# Patient Record
Sex: Male | Born: 1940 | Race: White | Hispanic: No | Marital: Married | Smoking: Never smoker
Health system: Southern US, Community
[De-identification: ages and names within clinical notes are randomized; demographics above are authoritative.]

## PROBLEM LIST (undated history)

## (undated) DIAGNOSIS — I509 Heart failure, unspecified: Secondary | ICD-10-CM

## (undated) DIAGNOSIS — R42 Dizziness and giddiness: Secondary | ICD-10-CM

## (undated) DIAGNOSIS — E785 Hyperlipidemia, unspecified: Secondary | ICD-10-CM

## (undated) DIAGNOSIS — J45909 Unspecified asthma, uncomplicated: Secondary | ICD-10-CM

## (undated) DIAGNOSIS — I4891 Unspecified atrial fibrillation: Secondary | ICD-10-CM

## (undated) DIAGNOSIS — N400 Enlarged prostate without lower urinary tract symptoms: Secondary | ICD-10-CM

## (undated) DIAGNOSIS — N289 Disorder of kidney and ureter, unspecified: Secondary | ICD-10-CM

## (undated) DIAGNOSIS — I10 Essential (primary) hypertension: Secondary | ICD-10-CM

## (undated) DIAGNOSIS — C9590 Leukemia, unspecified not having achieved remission: Secondary | ICD-10-CM

## (undated) HISTORY — PX: OTHER SURGICAL HISTORY: SHX169

## (undated) HISTORY — PX: HERNIA REPAIR: SHX51

## (undated) HISTORY — PX: BICEPS TENDON REPAIR: SHX566

---

## 2006-11-14 ENCOUNTER — Emergency Department: Payer: Self-pay | Admitting: Emergency Medicine

## 2006-12-30 ENCOUNTER — Emergency Department: Payer: Self-pay | Admitting: Emergency Medicine

## 2006-12-30 ENCOUNTER — Other Ambulatory Visit: Payer: Self-pay

## 2010-10-26 ENCOUNTER — Observation Stay: Payer: Self-pay | Admitting: Internal Medicine

## 2011-06-18 ENCOUNTER — Inpatient Hospital Stay: Payer: Self-pay | Admitting: *Deleted

## 2011-06-18 LAB — URINALYSIS, COMPLETE
Bacteria: NONE SEEN
Blood: NEGATIVE
Granular Cast: 9
Leukocyte Esterase: NEGATIVE
Nitrite: NEGATIVE
Ph: 5 (ref 4.5–8.0)
Protein: 100
RBC,UR: 1 /HPF (ref 0–5)
Specific Gravity: 1.016 (ref 1.003–1.030)

## 2011-06-18 LAB — TROPONIN I: Troponin-I: 0.03 ng/mL

## 2011-06-18 LAB — CBC
MCHC: 34.2 g/dL (ref 32.0–36.0)
MCV: 99 fL (ref 80–100)
Platelet: 188 10*3/uL (ref 150–440)
RBC: 3.97 10*6/uL — ABNORMAL LOW (ref 4.40–5.90)
RDW: 13.7 % (ref 11.5–14.5)
WBC: 9.2 10*3/uL (ref 3.8–10.6)

## 2011-06-18 LAB — COMPREHENSIVE METABOLIC PANEL
BUN: 25 mg/dL — ABNORMAL HIGH (ref 7–18)
Chloride: 102 mmol/L (ref 98–107)
Co2: 25 mmol/L (ref 21–32)
EGFR (African American): 45 — ABNORMAL LOW
EGFR (Non-African Amer.): 37 — ABNORMAL LOW
Osmolality: 282 (ref 275–301)
Potassium: 3.8 mmol/L (ref 3.5–5.1)
SGOT(AST): 39 U/L — ABNORMAL HIGH (ref 15–37)
SGPT (ALT): 30 U/L
Sodium: 139 mmol/L (ref 136–145)

## 2011-06-18 LAB — CK TOTAL AND CKMB (NOT AT ARMC): CK-MB: 4.6 ng/mL — ABNORMAL HIGH (ref 0.5–3.6)

## 2011-06-19 LAB — LIPID PANEL
Cholesterol: 121 mg/dL (ref 0–200)
Triglycerides: 97 mg/dL (ref 0–200)

## 2011-06-19 LAB — CK-MB
CK-MB: 2.4 ng/mL (ref 0.5–3.6)
CK-MB: 2.1 ng/mL
CK-MB: 2 ng/mL (ref 0.5–3.6)

## 2011-06-19 LAB — BASIC METABOLIC PANEL
Anion Gap: 11 (ref 7–16)
BUN: 25 mg/dL — ABNORMAL HIGH (ref 7–18)
Calcium, Total: 8.3 mg/dL — ABNORMAL LOW (ref 8.5–10.1)
Chloride: 104 mmol/L (ref 98–107)
Creatinine: 1.62 mg/dL — ABNORMAL HIGH (ref 0.60–1.30)
EGFR (African American): 55 — ABNORMAL LOW
Glucose: 105 mg/dL — ABNORMAL HIGH (ref 65–99)

## 2011-06-19 LAB — BASIC METABOLIC PANEL WITH GFR
Anion Gap: 11
BUN: 19 mg/dL — ABNORMAL HIGH
Calcium, Total: 8.8 mg/dL
Chloride: 103 mmol/L
Co2: 24 mmol/L
Creatinine: 1.29 mg/dL
EGFR (African American): >60
EGFR (Non-African Amer.): 59 — ABNORMAL LOW
Glucose: 112 mg/dL — ABNORMAL HIGH
Osmolality: 279
Potassium: 3.8 mmol/L
Sodium: 138 mmol/L

## 2011-06-19 LAB — CBC WITH DIFFERENTIAL/PLATELET
Basophil #: 0 10*3/uL (ref 0.0–0.1)
Eosinophil %: 1 %
HCT: 35.8 % — ABNORMAL LOW (ref 40.0–52.0)
HGB: 12.4 g/dL — ABNORMAL LOW (ref 13.0–18.0)
Lymphocyte #: 1.5 10*3/uL (ref 1.0–3.6)
Lymphocyte %: 19.2 %
MCH: 34.3 pg — ABNORMAL HIGH (ref 26.0–34.0)
MCV: 99 fL (ref 80–100)
Monocyte #: 1.4 10*3/uL — ABNORMAL HIGH (ref 0.0–0.7)
Monocyte %: 18.4 %
Neutrophil %: 61 %
Platelet: 216 10*3/uL (ref 150–440)
RDW: 13.9 % (ref 11.5–14.5)

## 2011-06-19 LAB — MAGNESIUM: Magnesium: 1.4 mg/dL — ABNORMAL LOW

## 2011-06-19 LAB — TROPONIN I
Troponin-I: 0.03 ng/mL
Troponin-I: 0.03 ng/mL

## 2011-06-20 LAB — MAGNESIUM: Magnesium: 1.4 mg/dL — ABNORMAL LOW

## 2011-06-20 LAB — BASIC METABOLIC PANEL
Anion Gap: 8 (ref 7–16)
Calcium, Total: 8.8 mg/dL (ref 8.5–10.1)
Co2: 25 mmol/L (ref 21–32)
EGFR (African American): >60
Glucose: 103 mg/dL — ABNORMAL HIGH (ref 65–99)
Sodium: 137 mmol/L (ref 136–145)

## 2011-06-21 LAB — BASIC METABOLIC PANEL
Calcium, Total: 9.2 mg/dL (ref 8.5–10.1)
Calcium, Total: 9.4 mg/dL (ref 8.5–10.1)
Chloride: 100 mmol/L (ref 98–107)
Creatinine: 1.27 mg/dL (ref 0.60–1.30)
EGFR (African American): >60
EGFR (African American): >60
EGFR (Non-African Amer.): 55 — ABNORMAL LOW
EGFR (Non-African Amer.): 60 — ABNORMAL LOW
Glucose: 114 mg/dL — ABNORMAL HIGH (ref 65–99)
Glucose: 94 mg/dL (ref 65–99)
Osmolality: 280 (ref 275–301)
Potassium: 4.1 mmol/L (ref 3.5–5.1)
Sodium: 138 mmol/L (ref 136–145)

## 2011-06-21 LAB — CBC
HGB: 14.8 g/dL (ref 13.0–18.0)
Platelet: 262 10*3/uL (ref 150–440)
RBC: 4.45 10*6/uL (ref 4.40–5.90)
WBC: 8.7 10*3/uL (ref 3.8–10.6)

## 2011-06-21 LAB — MAGNESIUM: Magnesium: 1.8 mg/dL

## 2011-06-21 LAB — PROTIME-INR: Prothrombin Time: 11.9 secs (ref 11.5–14.7)

## 2011-06-23 ENCOUNTER — Emergency Department: Payer: Self-pay

## 2012-10-15 ENCOUNTER — Emergency Department: Payer: Self-pay | Admitting: Emergency Medicine

## 2014-01-28 ENCOUNTER — Emergency Department: Payer: Self-pay | Admitting: Emergency Medicine

## 2014-04-21 ENCOUNTER — Emergency Department: Payer: Self-pay | Admitting: Emergency Medicine

## 2014-04-21 LAB — BASIC METABOLIC PANEL
ANION GAP: 10 (ref 7–16)
BUN: 24 mg/dL — AB (ref 7–18)
CALCIUM: 8.6 mg/dL (ref 8.5–10.1)
CO2: 24 mmol/L (ref 21–32)
CREATININE: 1.66 mg/dL — AB (ref 0.60–1.30)
Chloride: 107 mmol/L (ref 98–107)
EGFR (African American): 53 — ABNORMAL LOW
GFR CALC NON AF AMER: 44 — AB
GLUCOSE: 114 mg/dL — AB (ref 65–99)
OSMOLALITY: 286 (ref 275–301)
Potassium: 3.6 mmol/L (ref 3.5–5.1)
SODIUM: 141 mmol/L (ref 136–145)

## 2014-04-21 LAB — URINALYSIS, COMPLETE
Bacteria: NONE SEEN
Bilirubin,UR: NEGATIVE
Blood: NEGATIVE
Glucose,UR: NEGATIVE mg/dL (ref 0–75)
Ketone: NEGATIVE
Leukocyte Esterase: NEGATIVE
NITRITE: NEGATIVE
PROTEIN: NEGATIVE
Ph: 6 (ref 4.5–8.0)
RBC,UR: NONE SEEN /HPF (ref 0–5)
SPECIFIC GRAVITY: 1.009 (ref 1.003–1.030)
Squamous Epithelial: NONE SEEN

## 2014-04-21 LAB — CBC WITH DIFFERENTIAL/PLATELET
Basophil #: 0.1 10*3/uL (ref 0.0–0.1)
Basophil %: 0.9 %
Eosinophil #: 0.1 10*3/uL (ref 0.0–0.7)
Eosinophil %: 1.1 %
HCT: 39.3 % — AB (ref 40.0–52.0)
HGB: 13.3 g/dL (ref 13.0–18.0)
LYMPHS PCT: 22.1 %
Lymphocyte #: 2 10*3/uL (ref 1.0–3.6)
MCH: 33.9 pg (ref 26.0–34.0)
MCHC: 33.8 g/dL (ref 32.0–36.0)
MCV: 101 fL — ABNORMAL HIGH (ref 80–100)
MONOS PCT: 13.5 %
Monocyte #: 1.2 x10 3/mm — ABNORMAL HIGH (ref 0.2–1.0)
NEUTROS ABS: 5.6 10*3/uL (ref 1.4–6.5)
Neutrophil %: 62.4 %
Platelet: 269 10*3/uL (ref 150–440)
RBC: 3.91 10*6/uL — ABNORMAL LOW (ref 4.40–5.90)
RDW: 13.3 % (ref 11.5–14.5)
WBC: 9 10*3/uL (ref 3.8–10.6)

## 2014-04-21 LAB — TROPONIN I
TROPONIN-I: 0.03 ng/mL
Troponin-I: 0.06 ng/mL — ABNORMAL HIGH

## 2014-07-28 NOTE — Discharge Summary (Signed)
PATIENT NAME:  Dylan Faulkner, Dylan Faulkner MR#:  [id number] DATE OF BIRTH:  May 20, 1940  DATE OF ADMISSION:  06/18/2011 DATE OF DISCHARGE:  06/21/2011  DISCHARGE DIAGNOSES:  1. Presyncope most likely secondary to orthostatic hypotension resolved, may be secondary to Flomax.  2. Hypertension.  3. Hyperlipidemia.  4. Acute on chronic renal failure improved.  5. Benign prostatic hypertrophy. 6. Episode of nonsustained V. tach may be related to hypomagnesemia. He is status post cardiac catheterization with normal coronaries. 7. Chronic obstructive pulmonary disease. 8. Gastroesophageal reflux disease. 9. History of acute myelocytic leukemia in remission.   CONSULTATIONS:  Cardiology, Dr. Neoma Laming.   HOSPITAL COURSE: This is a 74 year old male who has history of hypertension, hyperlipidemia, benign prostatic hypertrophy, chronic obstructive pulmonary disease, history of AML in the past. He presented with nausea, dizziness, presyncope, lightheadedness, he felt he is going to pass out. He was found to be orthostatic in the Emergency Room. He was recently started on Flomax also. When his wife took his blood pressure at home it was found to be around 87 systolic. He was admitted as presyncope may be secondary to orthostatic hypotension. Also, he was dehydrated with  acute renal failure when he came in. At admission he was found to have a creatinine of 1.93 with a BUN of 25 and he was admitted to telemetry. He ruled out for myocardial infarction. His white count was normal at 7.8, hemoglobin was 12.4 and chest x-ray at admission was negative. All his antihypertensives were held because he was hypotensive, and orthostatic and he was given IV hydration. With IV hydration his orthostatics resolved. His blood pressure medications gradually reintroduced and blood pressure is running high right now, so he is continued on Norvasc, chlorthalidone and I have increased his lisinopril to 10 mg daily. When he came in, his EKG  showed that he had sinus rhythm at 64. He had an ultrasound carotids done that did not show any hemodynamically significant stenosis. Echocardiogram showed that the patient had normal LV function and borderline systolic function with wall motion abnormalities. He had an ejection fraction of 50 to 55%, LV function is low normal. He had mild septal hypokinesis. The patient had an episode of nonsustained V. tach during the hospital stay. He was hypomagnesemic that was replaced aggressively. His magnesium at discharge is 1.8. Cardiology was consulted and they took him to cardiac catheterization today after giving him renal protection for cardiac catheterization and cardiac catheterization was essentially normal. He had normal coronaries with borderline LV function, nonsustained ventricular tachycardia, most likely secondary to electrolyte abnormalities. He did well with physical therapy. He is no longer dizzy. will continue to hold his flomax. He needs to have a follow-up renal function checked at his primary care physician's office. He may be a candidate for beta blocker, but considering his heart rate was heart rate is borderline when he came in, this can be started as an outpatient.  DISCHARGE MEDICATIONS: Omeprazole 20 mg daily. Simvastatin 20 mg daily. Aspirin 81 mg daily, Loratadine 10 mg daily.  Albuterol 1 puff every six hours as needed Advair Diskus 100/50  b.i.d., fluticasone nasal spray once daily, chlorthalidone 25 mg a day, amlodipine 5 mg, finasteride 5 mg daily and change lisinopril to 10 mg p.o. daily.  Do not take Flomax.  Also I will give him magnesium oxide 400 mg p.o. b.i.d. for 2 days.  DIET: Advised a low sodium, low cholesterol diet.   CONDITION AT DISCHARGE: He is comfortable with no further dizziness.  T-max 98.6, heart rate 74, blood pressure 155/89, saturating 96% on room air. Chest is clear. Heart sounds are regular. Abdomen soft, nontender.   FOLLOWUP:  Follow up with Dr. Mariea Clonts at [facility] in 1 week. Follow-up BMP at [facility]. With cardiology, Dr. Neoma Laming in 1-2 weeks.    ____________________________ Mena Pauls, MD ag:ljs D: 06/21/2011 16:04:39 ET T: 06/22/2011 12:20:26 ET JOB#: [id number]  cc: Mena Pauls, MD, <Dictator> Dionisio David, MD Dr Mariea Clonts, [facility] Mena Pauls MD ELECTRONICALLY SIGNED 06/29/2011 15:27

## 2014-07-28 NOTE — Consult Note (Signed)
PATIENT NAME:  Dylan Faulkner, Dylan Faulkner MR#:  [id number] DATE OF BIRTH:  January 25, 1941  DATE OF CONSULTATION:  06/21/2011  REFERRING PHYSICIAN:  A. Lyndel Safe, MD CONSULTING PHYSICIAN:  Merla Riches, PA-C  PRIMARY CARE PHYSICIAN: Dr. Michiel Cowboy,  [facility]: Presyncope, dizziness, chest pressure, nonsustained V. tach.   HISTORY OF PRESENT ILLNESS: Mr. Dylan Faulkner is a pleasant 74 year old white male who looks older than his stated age. He has a history of acute myelogenous leukemia, hypertension, skin cancer, and hyperlipidemia who was doing yard work on Friday 06/18/2011. He did a lot of stooping and bending and became swimmy-headed, dizzy, nauseated, and had associated chest pressure. He described the chest discomfort as a tightness that did not radiate and he had associated shortness of breath. The patient sat down, did not lose any consciousness, and proceeded to the Emergency Department. He has chronic shortness of breath as a result of his chemotherapy for acute myelogenous leukemia and has resultant coughing and wheezing. He has two-pillow orthopnea but denies any edema or claudication symptoms.  He notes that recently he has had some increased muscle cramps.   The patient had a previous episode in July 2012 of similar symptoms and at that time underwent carotid ultrasound, and CT of the head did not show any significant abnormalities.   PAST MEDICAL HISTORY:  1. Hypertension.  2. Acute myelogenous leukemia.  3. Skin cancer.  4. Hyperlipidemia.  5. Benign prostatic hypertrophy.  6. Sleep apnea, awaiting CPAP machine.  7. 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  8. Gastroesophageal reflux disease.  9. History of presyncope due to heat exposure in July 2012.  10. Peripheral vascular disease with history of carotid artery stenosis, 50% bilaterally.   PAST SURGICAL HISTORY: Right biceps tendon repair.   SOCIAL  HISTORY: The patient denies any history of smoking, alcohol, or illicit drug use. He lives with his wife. He is disabled due to affects of acute myelogenous leukemia.   FAMILY HISTORY: No history of heart disease. He has multiple family members with  hypertension, sister and mother with cerebrovascular accidents. Brother with lung cancer.   ALLERGIES: No known drug allergies.   HOME MEDICATIONS:  1. Allegra 180 mg daily.  2. Flonase two sprays in each nostril daily.  3. Proventil as needed for shortness of breath.  4. Omeprazole 20 mg daily.  5. Advair 250/50 1 puff twice daily.  6. Aspirin 81 mg daily.  7. Lisinopril 5 mg p.o. daily.  8. Chlorthalidone 25 mg p.o. daily.  9. Tylenol as needed.  10. Flomax 0.4 mg daily (this was recently added).  11. Simvastatin 20 mg p.o. at bedtime.  12. Proscar 5 mg p.o. daily.  13. Amlodipine 5 mg p.o. daily.  14. Multivitamin 1 tablet p.o. daily.   REVIEW OF SYSTEMS: The patient denies fever, fatigue, blurred vision, or double vision. He has seasonal allergies with coughing, wheezing, sneezing. He has associated chest pressure as mentioned in the history of present illness.   PHYSICAL EXAMINATION:  GENERAL: This is a pleasant male who is not in any acute distress. He is alert and oriented times three.  He looks older than his stated age.   VITAL SIGNS: Temperature 98 degrees Fahrenheit, heart rate is 83, respiratory rate 22, blood pressure 167/106.   HEENT: Head atraumatic, normocephalic.  Eyes: Pupils are round, equal bilaterally, reactive to light. There is no scleral icterus. Conjunctivae are pale, pink. Ears and nose are  normal to external inspection. Mouth poor dentition. Moist mucous membranes.   NECK: Supple. Trachea is midline. Thyroid is smooth and mobile.   LUNGS: Clear to auscultation bilaterally with no adventitious breath sounds appreciated.   CARDIOVASCULAR: Faint heart sounds. Regular rate and rhythm. No murmurs, rubs, or gallops  can be appreciated.   ABDOMEN: Nondistended and soft. Bowel sounds present in all four quadrants. No hepatosplenomegaly.   EXTREMITIES: No cyanosis, clubbing, or edema.   ANCILLARY DATA: Chest x-ray with no acute disease. Urinalysis with no evidence of infection. Glucose 114, BUN 18, creatinine 1.37, sodium 139, potassium 4.1, chloride 99, CO2 30, estimated GFR 55, osmolality 280, calcium 9.2, phosphorus is 2.6. Magnesium 1.8 (after giving magnesium). Total CK 228, CK-MB 2, troponin I 0.03. White blood cell count 7.8, hemoglobin 12.4, hematocrit 35.8, platelet count 216,000. Echocardiogram: Borderline left ventricular ejection fraction 50-55%, wall motion abnormality with mild septal hypokinesis suggestive of coronary artery disease, trace to mild mitral regurgitation.   Carotid ultrasound: No hemodynamically significant carotid artery stenosis.   EKG: Normal sinus rhythm, rhythm, 93 beats per minute, premature atrial contractions, nonspecific ST and T wave changes. Review of telemetry shows episode of nonsustained V. tach at 3:00 a.m.   ASSESSMENT/PLAN:  1. Presyncope/vertigo, possibly secondary to orthostatic hypotension: The patient has had low blood pressure at home and was recently started on Flomax which could be contributing to his symptoms. His cardiac enzymes have been negative times three and he does not have any acute ST or T wave changes on his EKG today.  Antihypertensives have been held with the exception of Amlodipine which has already been restarted. Since his blood pressure, can restart lisinopril at 10 mg per day as he has decreased left ventricular ejection fraction. He had a head CT scan in July 2012 that was negative.  2. Hypertension: The patient blood pressure medicines have been held as noted above. We will restart lisinopril in addition to Amlodipine.  3. Chest tightness: The patient has septal hypokinesis on his echocardiogram and we will need to rule out cardiac ischemia.  The patient will be set up for cardiac catheterization later today. The procedure along with risks were discussed with the patient.  4. Hyperlipidemia: The patient is currently on statin therapy.  We will continue to follow this patient with you.   Thank you very much for this consultation and allowing [place] to participate in this patient's care. The case was discussed with Dr. Neoma Laming who agrees with the assessment and management of the patient above.    ____________________________ Merla Riches, PA-C for Neoma Laming, MD mam:bjt D: 06/21/2011 09:50:20 ET T: 06/21/2011 10:12:26 ET JOB#: [id number]  cc: Merla Riches, PA-C, <Dictator> Dr. Michiel Cowboy, [place] SIGNED 06/21/2011 16:30

## 2014-07-28 NOTE — Consult Note (Signed)
Brief Consult Note: Diagnosis: Pre-syncope, non-sustained v tach.   Patient was seen by consultant.   Consult note dictated.   Comments: Patient with episode of pre-syncope, orthostatic vitals, and had associated chest pressure during episode. Non-sustained v tach likely from combination of low Mg and sleep apnea (awaiting CPAP). No cardiac enzyme elevations or acute ST/T wave changes on EKG, but has septal hyokinesis on echo, and will need to r/o coronary ischemia. Will get stress test today. Since BP elevated, after stress test can restart ACE-I.  Electronic Signatures: Angelica Ran (MD)   (Signed 18-Mar-13 09:09)  Co-Signer: Brief Consult Note Wolfgang Finigan A (PA-C)   (Signed 18-Mar-13 08:51)  Authored: Brief Consult Note  Last Updated: 18-Mar-13 09:09 by Angelica Ran (MD)

## 2014-07-28 NOTE — H&P (Signed)
PATIENT NAME:  Dylan Faulkner, Dylan Faulkner MR#:  [id number] DATE OF BIRTH:  July 19, 1940  DATE OF ADMISSION:  06/18/2011  ER REFERRING PHYSICIAN: Pollie Friar, MD  PRIMARY CARE PHYSICIAN: [facility].  CHIEF COMPLAINT: Presyncope, lightheadedness, dizziness, and nausea.   HISTORY OF PRESENT ILLNESS: The patient is a 74 year old male with past medical history of AML, hypertension, skin cancer, and hyperlipidemia who was doing some light yard work and putting his tools away today when he all of a sudden developed lightheadedness and dizziness associated with some nausea. The patient felt like he was going to pass out; therefore, he sat down. He did not lose any consciousness. He felt that everything around him was spinning. When he went for the x-rays today, in the ED, he had similar symptoms. As per the ED physician, she found him to be orthostatic in the Emergency Room. The patient was recently started on Flomax, on 06/12/2011. A few days ago, he reported that he had some ringing in his ears. When his wife took his blood pressure at home, she found him to be hypotensive. His systolic blood pressure was around 87. The patient reports some cough and nasal congestion. The patient was admitted with similar symptoms in July 2012. At that time, he was diagnosed with presyncope due to heat exposure and benign positional vertigo. He had a carotid ultrasound which showed 50% stenosis on both sides and CT of the head was negative.   ALLERGIES: No known drug allergies.   PAST MEDICAL HISTORY:  1. Hypertension.  2. Acute myelocytic leukemia.  3. Skin cancer. 4. Hyperlipidemia. 5. Benign prostatic hypertrophy. 6. History of presyncope due to heat exposure, in July 2012. 7. History of benign positional vertigo.  8. Chronic obstructive pulmonary disease. 9. Gastroesophageal reflux disease.   SOCIAL HISTORY: He denies any history of smoking, alcohol, or drug abuse. He lives with his wife. He is disabled due to his  AML.    FAMILY HISTORY: Brother had hypertension and lung cancer. Mother had dementia.   CURRENT MEDICATIONS:  1. Allegra 180 mg daily.  2. Flonase two sprays to each nostril daily.  3. Proventil p.r.n. 4. Omeprazole 20 mg daily.  5. Advair 250/50 one puff twice a day. 6. Aspirin 81 mg daily.  7. Lisinopril 5 mg daily.  8. Chlorthalidone 25 mg daily.  9. Tylenol p.r.n. 10. Flomax 0.4 mg daily. 11. Simvastatin 20 mg daily.  12. Proscar 5 mg daily.  13. Amlodipine 5 mg daily.   REVIEW OF SYSTEMS: CONSTITUTIONAL: The patient denies any fatigue or fever. EYES: Denies any blurred or double vision. ENT: Reports some tinnitus a couple of days ago. Denies any ear pain. He has some hearing loss. Reports some cough. Denies any wheezing or dyspnea. CVS: Denies any chest pain or palpitations. GASTROINTESTINAL: Had some nausea earlier. Denies any vomiting or diarrhea. GU: Denies any dysuria or hematuria. ENDOCRINE: Denies any polyuria or nocturia. HEME/LYMPH: Denies any anemia or easy bruisability. INTEGUMENT: Denies any acne or rash. MUSCULOSKELETAL: Denies any swelling or gout. NEUROLOGICAL: Denies any numbness or weakness. PSYCH: Denies any anxiety or nervousness.   PHYSICAL EXAMINATION:   VITAL SIGNS: Temperature 98.8, heart rate 98, respiratory rate 20, blood pressure 103/60, and pulse oximetry 94% on room air.   GENERAL: The patient is a 74 year old male lying comfortably in bed, not in acute distress.   HEAD: Atraumatic, normocephalic.   EYES: No pallor, icterus, or cyanosis. Pupils are equally round and reactive to light and accommodation. Extraocular movements intact.  ENT: Wet mucous membranes. No oropharyngeal erythema or thrush.   NECK: Supple. No masses. No JVD. No thyromegaly. No lymphadenopathy.   CHEST WALL: No tenderness to palpation. Not using accessory muscles of respiration. No intercostal muscle retractions.   LUNGS: Bilaterally clear to auscultation. No wheezing, rales,  or rhonchi.   CARDIOVASCULAR: S1 and S2 regular. No murmurs, rubs, or gallops.   ABDOMEN: Soft, nontender, and nondistended. No guarding or rigidity. No organomegaly. Normal bowel sounds.   SKIN: No rashes or lesions.   PERIPHERIES: No pedal edema. 1+ pedal pulses.   MUSCULOSKELETAL: No cyanosis or clubbing.   NEUROLOGIC: Awake, alert, and oriented x3. Nonfocal neurological exam. Cranial nerves grossly intact.   PSYCH: Normal mood and affect.  RESULTS: Urinalysis shows no evidence of infection.   Chest x-ray shows no acute disease.   White count 9.2, hemoglobin 13.5, hematocrit 39.4, platelets 188. CK 464. Troponin 0..03. Glucose 93, BUN 25, creatinine 1.93, AST 39.   ASSESSMENT AND PLAN: This is a 74 year old male with past medical history of hypertension, AML in remission, hyperlipidemia and benign prostatic hypertrophy who recently started taking Flomax who presents with presyncopal symptoms and vertigo.  1. Presyncope/vertigo of unclear etiology, possibly related to orthostatic hypotension. The patient was found to be orthostatic in the Emergency Room. He also had low blood pressure at home. He was recently started on Flomax which could also be the culprit. We will admit the patient to the hospital on telemetry, check serial cardiac enzymes, hold his antihypertensive medications including amlodipine, lisinopril, and the diuretic chlorthalidone, and Flomax. The patient has had a prior CAT scan which has been negative. Therefore, we will not repeat that. His neurological examination is nonfocal. We will do a 2-D echocardiogram and carotid ultrasound since he had 50% stenosis on both sides in July 2012. We will also check orthostatic vital signs and hydrate with IV fluids. 2. Acute renal failure. The patient's baseline creatinine is around 1.3. His creatinine today is 1.93. We will hydrate with IV fluids, monitor strict ins and outs, and avoid nephrotoxic medications. The etiology is unclear  at present. 3. Mildly elevated CK. Not in the range of rhabdomyolysis. We will hydrate with IV fluids.  4. Hypertension. The patient was hypotensive; therefore, we will hold his antihypertensive medication.  5. Acute myelocytic leukemia. The patient is currently in remission. 6. Hyperlipidemia. We will continue statin therapy.  7. Benign prostatic hypertrophy. We will continue Proscar. 8. History of chronic obstructive pulmonary disease. We will continue Advair and Proventil and place on p.r.n. nebulizer treatment. Currently there is no evidence of any exacerbation.  9. Gastroesophageal reflux disease. We will continue PPI therapy. We will place on GI and DVT prophylaxis.   I reviewed all medical records, discussed with the ED physician, and discussed with the patient and his son the plan of care and management.   TIME SPENT: 75 minutes. ____________________________ Cherre Huger, MD sp:slb D: 06/18/2011 19:51:40 ET T: 06/19/2011 08:11:51 ET JOB#: [id number]  cc: Cherre Huger, MD, <Dictator> PCP - [facility] MD ELECTRONICALLY SIGNED 06/21/2011 7:36

## 2015-05-15 ENCOUNTER — Observation Stay
Admission: EM | Admit: 2015-05-15 | Discharge: 2015-05-16 | Disposition: A | Discharge status: Home / Self Care | Payer: Medicare HMO | Attending: Internal Medicine | Admitting: Internal Medicine

## 2015-05-15 ENCOUNTER — Emergency Department: Payer: Medicare HMO

## 2015-05-15 DIAGNOSIS — I5020 Unspecified systolic (congestive) heart failure: Secondary | ICD-10-CM | POA: Insufficient documentation

## 2015-05-15 DIAGNOSIS — R5383 Other fatigue: Secondary | ICD-10-CM | POA: Diagnosis not present

## 2015-05-15 DIAGNOSIS — I214 Non-ST elevation (NSTEMI) myocardial infarction: Secondary | ICD-10-CM

## 2015-05-15 DIAGNOSIS — I4891 Unspecified atrial fibrillation: Secondary | ICD-10-CM | POA: Insufficient documentation

## 2015-05-15 DIAGNOSIS — Z7901 Long term (current) use of anticoagulants: Secondary | ICD-10-CM | POA: Insufficient documentation

## 2015-05-15 DIAGNOSIS — R748 Abnormal levels of other serum enzymes: Secondary | ICD-10-CM | POA: Insufficient documentation

## 2015-05-15 DIAGNOSIS — R0602 Shortness of breath: Secondary | ICD-10-CM | POA: Diagnosis not present

## 2015-05-15 DIAGNOSIS — I2510 Atherosclerotic heart disease of native coronary artery without angina pectoris: Secondary | ICD-10-CM | POA: Diagnosis not present

## 2015-05-15 DIAGNOSIS — N179 Acute kidney failure, unspecified: Secondary | ICD-10-CM | POA: Diagnosis not present

## 2015-05-15 DIAGNOSIS — R55 Syncope and collapse: Principal | ICD-10-CM | POA: Insufficient documentation

## 2015-05-15 DIAGNOSIS — Z7982 Long term (current) use of aspirin: Secondary | ICD-10-CM | POA: Diagnosis not present

## 2015-05-15 DIAGNOSIS — I5022 Chronic systolic (congestive) heart failure: Secondary | ICD-10-CM | POA: Insufficient documentation

## 2015-05-15 DIAGNOSIS — Z7951 Long term (current) use of inhaled steroids: Secondary | ICD-10-CM | POA: Diagnosis not present

## 2015-05-15 DIAGNOSIS — R42 Dizziness and giddiness: Secondary | ICD-10-CM | POA: Insufficient documentation

## 2015-05-15 DIAGNOSIS — R079 Chest pain, unspecified: Secondary | ICD-10-CM | POA: Insufficient documentation

## 2015-05-15 DIAGNOSIS — I129 Hypertensive chronic kidney disease with stage 1 through stage 4 chronic kidney disease, or unspecified chronic kidney disease: Secondary | ICD-10-CM | POA: Insufficient documentation

## 2015-05-15 DIAGNOSIS — E785 Hyperlipidemia, unspecified: Secondary | ICD-10-CM | POA: Diagnosis not present

## 2015-05-15 DIAGNOSIS — I482 Chronic atrial fibrillation: Secondary | ICD-10-CM | POA: Insufficient documentation

## 2015-05-15 DIAGNOSIS — Z79899 Other long term (current) drug therapy: Secondary | ICD-10-CM | POA: Insufficient documentation

## 2015-05-15 DIAGNOSIS — N189 Chronic kidney disease, unspecified: Secondary | ICD-10-CM | POA: Insufficient documentation

## 2015-05-15 DIAGNOSIS — E860 Dehydration: Secondary | ICD-10-CM | POA: Diagnosis not present

## 2015-05-15 DIAGNOSIS — Z8673 Personal history of transient ischemic attack (TIA), and cerebral infarction without residual deficits: Secondary | ICD-10-CM | POA: Insufficient documentation

## 2015-05-15 DIAGNOSIS — N400 Enlarged prostate without lower urinary tract symptoms: Secondary | ICD-10-CM | POA: Diagnosis not present

## 2015-05-15 HISTORY — DX: Hyperlipidemia, unspecified: E78.5

## 2015-05-15 HISTORY — DX: Essential (primary) hypertension: I10

## 2015-05-15 HISTORY — DX: Dizziness and giddiness: R42

## 2015-05-15 HISTORY — DX: Leukemia, unspecified not having achieved remission: C95.90

## 2015-05-15 HISTORY — DX: Benign prostatic hyperplasia without lower urinary tract symptoms: N40.0

## 2015-05-15 HISTORY — DX: Heart failure, unspecified: I50.9

## 2015-05-15 LAB — CBC
HEMATOCRIT: 35.8 % — AB (ref 40.0–52.0)
HEMOGLOBIN: 12.2 g/dL — AB (ref 13.0–18.0)
MCH: 33.2 pg (ref 26.0–34.0)
MCHC: 34.1 g/dL (ref 32.0–36.0)
MCV: 97.6 fL (ref 80.0–100.0)
Platelets: 184 10*3/uL (ref 150–440)
RBC: 3.67 MIL/uL — AB (ref 4.40–5.90)
RDW: 14.8 % — ABNORMAL HIGH (ref 11.5–14.5)
WBC: 8.1 10*3/uL (ref 3.8–10.6)

## 2015-05-15 NOTE — ED Notes (Signed)
Pt reports he was taking the trash down to the street and began to have central/left chest pain rated at an 8 out of 10 described as tightness. Pt reports he also experienced lightheadedness/dizziness, SOB, nausea. Pt denies diaphoresis. Pt is still c/o of all symptoms. Pt reports he had similar episode 2 months ago and was prescribed nitroglycerin. Pt reports he did not take any of the nitro prescription this evening.

## 2015-05-15 NOTE — ED Notes (Signed)
Pt arrived via EMS - c/o chest pain and SOB after taking the trash out tonight - Pt has Nitro ordered but did not take - Hx of HTN, heart failure, vertigo, chest pain - Pt was at his doctor yesterday for vertigo

## 2015-05-15 NOTE — ED Notes (Signed)
MD Brown at bedside.

## 2015-05-16 DIAGNOSIS — R55 Syncope and collapse: Secondary | ICD-10-CM | POA: Diagnosis present

## 2015-05-16 LAB — BASIC METABOLIC PANEL
ANION GAP: 6 (ref 5–15)
ANION GAP: 7 (ref 5–15)
BUN: 45 mg/dL — AB (ref 6–20)
BUN: 44 mg/dL — AB (ref 6–20)
CALCIUM: 8.6 mg/dL — AB (ref 8.9–10.3)
CO2: 26 mmol/L (ref 22–32)
CO2: 27 mmol/L (ref 22–32)
CREATININE: 1.84 mg/dL — AB (ref 0.61–1.24)
Calcium: 8.8 mg/dL — ABNORMAL LOW (ref 8.9–10.3)
Chloride: 106 mmol/L (ref 101–111)
Chloride: 104 mmol/L (ref 101–111)
Creatinine, Ser: 2.06 mg/dL — ABNORMAL HIGH (ref 0.61–1.24)
GFR calc Af Amer: 41 mL/min — ABNORMAL LOW (ref >60)
GFR, EST AFRICAN AMERICAN: 35 mL/min — AB (ref >60)
GFR, EST NON AFRICAN AMERICAN: 31 mL/min — AB (ref >60)
GFR, EST NON AFRICAN AMERICAN: 35 mL/min — AB (ref >60)
GLUCOSE: 103 mg/dL — AB (ref 65–99)
Glucose, Bld: 107 mg/dL — ABNORMAL HIGH (ref 65–99)
POTASSIUM: 4.4 mmol/L (ref 3.5–5.1)
Potassium: 4.2 mmol/L (ref 3.5–5.1)
SODIUM: 138 mmol/L (ref 135–145)
Sodium: 138 mmol/L (ref 135–145)

## 2015-05-16 LAB — TROPONIN I
TROPONIN I: 0.06 ng/mL — AB (ref <0.031)
Troponin I: 0.06 ng/mL — ABNORMAL HIGH (ref <0.031)
Troponin I: 0.06 ng/mL — ABNORMAL HIGH (ref <0.031)
Troponin I: 0.06 ng/mL — ABNORMAL HIGH (ref <0.031)

## 2015-05-16 LAB — HEMOGLOBIN A1C: Hgb A1c MFr Bld: 5.6 % (ref 4.0–6.0)

## 2015-05-16 LAB — PROTIME-INR
INR: 3.65
INR: 3.79
PROTHROMBIN TIME: 35.5 s — AB (ref 11.4–15.0)
Prothrombin Time: 36.5 seconds — ABNORMAL HIGH (ref 11.4–15.0)

## 2015-05-16 LAB — TSH: TSH: 3.209 u[IU]/mL (ref 0.350–4.500)

## 2015-05-16 MED ORDER — ONDANSETRON HCL 4 MG/2ML IJ SOLN: 4.0000 mg | Freq: Four times a day (QID) | INTRAMUSCULAR | Status: DC | PRN

## 2015-05-16 MED ORDER — MORPHINE SULFATE (PF) 4 MG/ML IV SOLN
4.0000 mg | Freq: Once | INTRAVENOUS | Status: AC
  Administered 2015-05-16: 4 mg via INTRAVENOUS

## 2015-05-16 MED ORDER — SODIUM CHLORIDE 0.9% FLUSH
3.0000 mL | Freq: Two times a day (BID) | INTRAVENOUS | Status: DC
  Administered 2015-05-16: 3 mL via INTRAVENOUS

## 2015-05-16 MED ORDER — SODIUM CHLORIDE 0.9 % IV SOLN
INTRAVENOUS | Status: DC
  Administered 2015-05-16: 04:00:00 via INTRAVENOUS

## 2015-05-16 MED ORDER — ASPIRIN 81 MG PO CHEW
324.0000 mg | CHEWABLE_TABLET | Freq: Once | ORAL | Status: AC
  Administered 2015-05-16: 324 mg via ORAL
  Filled 2015-05-16: qty 4

## 2015-05-16 MED ORDER — GABAPENTIN 300 MG PO CAPS
600.0000 mg | ORAL_CAPSULE | Freq: Three times a day (TID) | ORAL | Status: DC
  Administered 2015-05-16: 600 mg via ORAL
  Filled 2015-05-16: qty 2

## 2015-05-16 MED ORDER — HYDROMORPHONE HCL 1 MG/ML IJ SOLN
1.0000 mg | Freq: Once | INTRAMUSCULAR | Status: AC
  Administered 2015-05-16: 1 mg via INTRAVENOUS
  Filled 2015-05-16: qty 1

## 2015-05-16 MED ORDER — FINASTERIDE 5 MG PO TABS
5.0000 mg | ORAL_TABLET | Freq: Every day | ORAL | Status: DC
  Administered 2015-05-16: 5 mg via ORAL
  Filled 2015-05-16: qty 1

## 2015-05-16 MED ORDER — NITROGLYCERIN 0.4 MG SL SUBL: 0.4000 mg | SUBLINGUAL_TABLET | SUBLINGUAL | Status: DC | PRN

## 2015-05-16 MED ORDER — ONDANSETRON HCL 4 MG PO TABS: 4.0000 mg | ORAL_TABLET | Freq: Four times a day (QID) | ORAL | Status: DC | PRN

## 2015-05-16 MED ORDER — METOPROLOL SUCCINATE ER 25 MG PO TB24
25.0000 mg | ORAL_TABLET | Freq: Every day | ORAL | Status: DC
  Administered 2015-05-16: 25 mg via ORAL
  Filled 2015-05-16: qty 1

## 2015-05-16 MED ORDER — MAGNESIUM OXIDE 400 (241.3 MG) MG PO TABS
400.0000 mg | ORAL_TABLET | Freq: Every day | ORAL | Status: DC
  Administered 2015-05-16: 400 mg via ORAL
  Filled 2015-05-16: qty 1

## 2015-05-16 MED ORDER — TAMSULOSIN HCL 0.4 MG PO CAPS: 0.4000 mg | ORAL_CAPSULE | Freq: Every day | ORAL | Status: DC

## 2015-05-16 MED ORDER — ISOSORBIDE MONONITRATE ER 30 MG PO TB24
30.0000 mg | ORAL_TABLET | Freq: Every day | ORAL | Status: DC
  Administered 2015-05-16: 30 mg via ORAL
  Filled 2015-05-16: qty 1

## 2015-05-16 MED ORDER — FUROSEMIDE 20 MG PO TABS
20.0000 mg | ORAL_TABLET | Freq: Two times a day (BID) | ORAL | Status: DC
  Administered 2015-05-16: 20 mg via ORAL
  Filled 2015-05-16: qty 1

## 2015-05-16 MED ORDER — LISINOPRIL 10 MG PO TABS
10.0000 mg | ORAL_TABLET | Freq: Every day | ORAL | Status: DC
  Administered 2015-05-16: 10 mg via ORAL
  Filled 2015-05-16: qty 1

## 2015-05-16 MED ORDER — ASPIRIN 81 MG PO CHEW
81.0000 mg | CHEWABLE_TABLET | Freq: Every day | ORAL | Status: DC
  Administered 2015-05-16: 81 mg via ORAL
  Filled 2015-05-16: qty 1

## 2015-05-16 MED ORDER — DOCUSATE SODIUM 100 MG PO CAPS
100.0000 mg | ORAL_CAPSULE | Freq: Two times a day (BID) | ORAL | Status: DC
  Administered 2015-05-16 (×2): 100 mg via ORAL
  Filled 2015-05-16 (×2): qty 1

## 2015-05-16 MED ORDER — ACETAMINOPHEN 325 MG PO TABS
650.0000 mg | ORAL_TABLET | Freq: Four times a day (QID) | ORAL | Status: DC | PRN
Start: 2015-05-16 — End: 2015-05-16

## 2015-05-16 MED ORDER — ONDANSETRON HCL 4 MG/2ML IJ SOLN
4.0000 mg | Freq: Once | INTRAMUSCULAR | Status: AC
  Administered 2015-05-16: 4 mg via INTRAVENOUS
  Filled 2015-05-16: qty 2

## 2015-05-16 MED ORDER — SIMVASTATIN 20 MG PO TABS
20.0000 mg | ORAL_TABLET | Freq: Every day | ORAL | Status: DC
  Administered 2015-05-16: 20 mg via ORAL
  Filled 2015-05-16: qty 1

## 2015-05-16 MED ORDER — MORPHINE SULFATE (PF) 4 MG/ML IV SOLN
INTRAVENOUS | Status: AC
  Administered 2015-05-16: 4 mg via INTRAVENOUS
  Filled 2015-05-16: qty 1

## 2015-05-16 MED ORDER — WARFARIN SODIUM 5 MG PO TABS: 5.0000 mg | ORAL_TABLET | Freq: Every day | ORAL | Status: DC

## 2015-05-16 MED ORDER — MORPHINE SULFATE (PF) 2 MG/ML IV SOLN: 2.0000 mg | INTRAVENOUS | Status: DC | PRN

## 2015-05-16 MED ORDER — SODIUM CHLORIDE 0.9 % IV SOLN: INTRAVENOUS | Status: DC

## 2015-05-16 MED ORDER — ACETAMINOPHEN 650 MG RE SUPP
650.0000 mg | Freq: Four times a day (QID) | RECTAL | Status: DC | PRN
Start: 2015-05-16 — End: 2015-05-16

## 2015-05-16 MED ORDER — MORPHINE SULFATE (PF) 2 MG/ML IV SOLN
2.0000 mg | Freq: Once | INTRAVENOUS | Status: AC
  Administered 2015-05-16: 2 mg via INTRAVENOUS
  Filled 2015-05-16: qty 1

## 2015-05-16 MED ORDER — ISOSORBIDE MONONITRATE ER 30 MG PO TB24: 30.0000 mg | ORAL_TABLET | Freq: Every day | ORAL | Status: AC

## 2015-05-16 NOTE — Discharge Summary (Signed)
[facility] NAME: Dylan Faulkner    MR#:  [id number]  DATE OF BIRTH:  04-Nov-1940  DATE OF ADMISSION:  05/15/2015 ADMITTING PHYSICIAN: Harrie Foreman, MD  DATE OF DISCHARGE: 05/16/2015  PRIMARY CARE PHYSICIAN: Juanell Fairly, MD    ADMISSION DIAGNOSIS:  NSTEMI (non-ST elevated myocardial infarction) [facility]) [I21.4] Chest pain, unspecified chest pain type [R07.9] Syncope, unspecified syncope type [R55]  DISCHARGE DIAGNOSIS:  Active Problems:   Syncope   SECONDARY DIAGNOSIS:   Past Medical History  Diagnosis Date  . Hypertension   . Heart failure ([place])   . Vertigo   . 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 Hyperlipidemia   . Enlarged prostate     HOSPITAL COURSE:   75 year old male with a history of atrial fibrillation and systolic heart failure presented chest pain. Further details please further H&P.  1. Chest pain: Troponins 3 were 0.06. This is not acute coronary syndrome. EKG showed no evidence of myocardial infarction. Telemetry showed no evidence of myocardial infection. Patient seen and evaluated cardiology. Patient will have outpatient stress test performed on Monday. Patient had no chest pain on the hospital. Patient does have a cardiologist at [facility] however will see Dr Humphrey Rolls for stress test.   2. Chronic systolic heart failure: Ejection depression 40%. Patient will continue Lasix.  3. Acute on chronic injury: This is due to prerenal azotemia from dehydration. Patient's creatinine had improved with IV fluids. Patient was on Lasix and lisinopril on the hospital with improved creatinine.   4. Atrial fibrillation, chronic: Patient's INR was elevated to Coumadin was on hold. He will need close follow-up for INR check. He was given instructions to restart this on Sunday. 5. Essential hypertension: Continue lisinopril and metoprolol. Blood pressure is tolerable.  6. BPH: Continue finasteride and  Flomax.   DISCHARGE CONDITIONS AND DIET:   Patient is able to be discharged home in stable condition on a heart healthy diet  CONSULTS OBTAINED:  Treatment Team:  Dionisio David, MD  DRUG ALLERGIES:  No Known Allergies  DISCHARGE MEDICATIONS:   Current Discharge Medication List    START taking these medications   Details  isosorbide mononitrate (IMDUR) 30 MG 24 hr tablet Take 1 tablet (30 mg total) by mouth daily. Qty: 30 tablet, Refills: 0      CONTINUE these medications which have NOT CHANGED   Details  acetaminophen (TYLENOL) 500 MG tablet Take 1,000 mg by mouth every 8 (eight) hours as needed for mild pain, fever or headache.    albuterol (PROVENTIL HFA;VENTOLIN HFA) 108 (90 Base) MCG/ACT inhaler Inhale 2 puffs into the lungs every 4 (four) hours as needed for wheezing.    aspirin 81 MG tablet Take 81 mg by mouth daily.    cyanocobalamin 500 MCG tablet Take 500 mcg by mouth daily.    finasteride (PROSCAR) 5 MG tablet Take 5 mg by mouth daily.    fluticasone (FLONASE) 50 MCG/ACT nasal spray Place 2 sprays into both nostrils daily as needed for allergies or rhinitis.    Fluticasone-Salmeterol (ADVAIR) 250-50 MCG/DOSE AEPB Inhale 1 puff into the lungs 2 (two) times daily.    folic acid (FOLVITE) 1 MG tablet Take 1 mg by mouth daily.    furosemide (LASIX) 20 MG tablet Take 20 mg by mouth 2 (two) times daily.    gabapentin (NEURONTIN) 600 MG tablet Take 600 mg by mouth 3 (three) times daily.  HYDROcodone-acetaminophen (NORCO) 10-325 MG tablet Take 1 tablet by mouth every 6 (six) hours as needed for moderate pain or severe pain.    lisinopril (PRINIVIL,ZESTRIL) 10 MG tablet Take 10 mg by mouth daily.    loratadine (CLARITIN) 10 MG tablet Take 10 mg by mouth daily.    magnesium oxide (MAG-OX) 400 MG tablet Take 400 mg by mouth daily.    meclizine (ANTIVERT) 25 MG tablet Take 25 mg by mouth 3 (three) times daily as needed for dizziness.    metoprolol tartrate  (LOPRESSOR) 25 MG tablet Take 25 mg by mouth 2 (two) times daily.    montelukast (SINGULAIR) 10 MG tablet Take 10 mg by mouth daily.    Multiple Vitamin (MULTIVITAMIN WITH MINERALS) TABS tablet Take 1 tablet by mouth daily.    nitroGLYCERIN (NITROSTAT) 0.4 MG SL tablet Place 0.4 mg under the tongue every 5 (five) minutes as needed for chest pain.    omeprazole (PRILOSEC) 20 MG capsule Take 20 mg by mouth daily.    pyridOXINE (VITAMIN B-6) 50 MG tablet Take 50 mg by mouth daily.    simvastatin (ZOCOR) 20 MG tablet Take 20 mg by mouth daily.    tamsulosin (FLOMAX) 0.4 MG CAPS capsule Take 0.4 mg by mouth daily.     warfarin (COUMADIN) 5 MG tablet Take 2.5-5 mg by mouth daily. Take one tablet (5mg ) daily Monday-Friday Take 1/2 tablet (2.5mg ) on Saturday and Sunday      STOP taking these medications     metoprolol succinate (TOPROL-XL) 25 MG 24 hr tablet               Today   CHIEF COMPLAINT:  Patient voices no chest pain or shortness of breath. He was pulling the trash can and then developed chest pain.   VITAL SIGNS:  Blood pressure 120/73, pulse 52, temperature 98.2 F (36.8 C), temperature source Oral, resp. rate 16, height 5\' 10"  (1.778 m), weight 75.206 kg (165 lb 12.8 oz), SpO2 97 %.   REVIEW OF SYSTEMS:  Review of Systems  Constitutional: Negative for fever, chills and malaise/fatigue.  HENT: Negative for sore throat.   Eyes: Negative for blurred vision.  Respiratory: Negative for cough, hemoptysis, shortness of breath and wheezing.   Cardiovascular: Negative for chest pain, palpitations and leg swelling.  Gastrointestinal: Negative for nausea, vomiting, abdominal pain, diarrhea and blood in stool.  Genitourinary: Negative for dysuria.  Musculoskeletal: Negative for back pain.  Neurological: Negative for dizziness, tremors and headaches.  Endo/Heme/Allergies: Does not bruise/bleed easily.     PHYSICAL EXAMINATION:  GENERAL:  75 y.o.-year-old patient  lying in the bed with no acute distress.  NECK:  Supple, no jugular venous distention. No thyroid enlargement, no tenderness.  LUNGS: Normal breath sounds bilaterally, no wheezing, rales,rhonchi  No use of accessory muscles of respiration.  CARDIOVASCULAR: Irregular, irregular. No murmurs, rubs, or gallops.  ABDOMEN: Soft, non-tender, non-distended. Bowel sounds present. No organomegaly or mass.  EXTREMITIES: No pedal edema, cyanosis, or clubbing.  PSYCHIATRIC: The patient is alert and oriented x 3.  SKIN: No obvious rash, lesion, or ulcer.   DATA REVIEW:   CBC  Recent Labs Lab 05/15/15 2324  WBC 8.1  HGB 12.2*  HCT 35.8*  PLT 184    Chemistries   Recent Labs Lab 05/16/15 0423  NA 138  K 4.2  CL 104  CO2 27  GLUCOSE 103*  BUN 44*  CREATININE 1.84*  CALCIUM 8.6*    Cardiac Enzymes  Recent Labs Lab  05/15/15 2324 05/16/15 0302 05/16/15 0423  TROPONINI 0.06* 0.06* 0.06*    Microbiology Results  @[email or web address] @  RADIOLOGY:  Dg Chest Port 1 View  05/15/2015  CLINICAL DATA:  75 year old male with chest pain EXAM: PORTABLE CHEST 1 VIEW COMPARISON:  None. FINDINGS: Single portable view of the chest does not demonstrate a focal consolidation. There is no pleural effusion or pneumothorax. The cardiac silhouette is within normal limits. There is degenerative changes of the spine. Lower cervical fixation plate and screws. IMPRESSION: No active disease. Electronically Signed   By: Anner Crete M.D.   On: 05/15/2015 23:43      Management plans discussed with the patient and he is in agreement. Stable for discharge   Patient should follow up with PCP Monday for INR  CODE STATUS:     Code Status Orders        Start     Ordered   05/16/15 0343  Full code   Continuous     05/16/15 0342    Code Status History    Date Active Date Inactive Code Status Order ID Comments User Context   This patient has a current code status but no historical code status.       TOTAL TIME TAKING CARE OF THIS PATIENT: 35 minutes.    Note: This dictation was prepared with Dragon dictation along with smaller phrase technology. Any transcriptional errors that result from this process are unintentional.  Imane Burrough M.D on 05/16/2015 at 10:22 AM  Between 7am to 6pm - Pager - [phone] After 6pm go to [email or web address] - password [email or web address] [facility]  [place] [facility]  Office  [phone]  CC: Primary care physician; Juanell Fairly, MD

## 2015-05-16 NOTE — ED Notes (Signed)
Called floor to let them know patient on the way up.

## 2015-05-16 NOTE — Care Management Obs Status (Signed)
[place] NOTIFICATION   Patient Details  Name: ELDON LATTER MRN: [id number] Date of Birth: [birth year]   Medicare Observation Status Notification Given:  Yes    Katrina Stack, RN 05/16/2015, 12:01 PM

## 2015-05-16 NOTE — Progress Notes (Signed)
Pt admitted to room 240. VSS, A&O x3, disoriented to time, no acute distress. Pt states pain is well controlled at a 4/10 at this time. Pt oriented to unit, educated on telephone, call bell, bed alarm, and need to call nursing before up out of bed. Skin assessed with Eliezer Champagne, RN. Telemetry verified with Donald Prose, RN. Will continue to monitor and treat per MD orders. Rachael Fee, RN

## 2015-05-16 NOTE — ED Notes (Addendum)
MD Marcille Blanco at bedside. Transport to floor delayed

## 2015-05-16 NOTE — ED Provider Notes (Signed)
[facility] Emergency Department Provider Note  ____________________________________________  Time seen: 11:40 PM  I have reviewed the triage vital signs and the nursing notes.   HISTORY  Chief Complaint Chest Pain    HPI DANIYAL FELS is a 75 y.o. male with history of atrial fibrillation, congestive heart failure, leukemia, hypertension and vertigo presents with complaint of central chest pain described as tightness while taking out the trash tonight. Patient also states that he had a episode of "almost passing out". In addition the patient admits to dyspnea with exertion. Current pain score 9 out of 10 nonradiating.     Past Medical History  Diagnosis Date  . Hypertension   . Heart failure ([place])   . Vertigo   . Leukemia ([place])     20 years ago  . Hyperlipidemia   . Enlarged prostate     Patient Active Problem List   Diagnosis Date Noted  . Syncope 05/16/2015    Past Surgical History  Procedure Laterality Date  . Hernia repair    . Neck fusion      Current Outpatient Rx  Name  Route  Sig  Dispense  Refill  . aspirin 81 MG tablet   Oral   Take 81 mg by mouth daily.         . finasteride (PROSCAR) 5 MG tablet   Oral   Take 5 mg by mouth daily.         . furosemide (LASIX) 20 MG tablet   Oral   Take 20 mg by mouth 2 (two) times daily.         Marland Kitchen gabapentin (NEURONTIN) 600 MG tablet   Oral   Take 600 mg by mouth 3 (three) times daily.         Marland Kitchen lisinopril (PRINIVIL,ZESTRIL) 10 MG tablet   Oral   Take 10 mg by mouth daily.         . magnesium oxide (MAG-OX) 400 MG tablet   Oral   Take 400 mg by mouth daily.         . metoprolol succinate (TOPROL-XL) 25 MG 24 hr tablet   Oral   Take 25 mg by mouth daily.         . nitroGLYCERIN (NITROSTAT) 0.4 MG SL tablet   Sublingual   Place 0.4 mg under the tongue every 5 (five) minutes as needed for chest pain.         . simvastatin (ZOCOR) 20 MG tablet   Oral   Take  20 mg by mouth daily.         . tamsulosin (FLOMAX) 0.4 MG CAPS capsule   Oral   Take 0.4 mg by mouth.         . warfarin (COUMADIN) 1 MG tablet   Oral   Take 5 mg by mouth daily.            Allergies  no known drug allergies  History reviewed. No pertinent family history.  Social History Social History  Substance Use Topics  . Smoking status: Never Smoker   . Smokeless tobacco: None  . Alcohol Use: No    Review of Systems  Constitutional: Negative for fever. Eyes: Negative for visual changes. ENT: Negative for sore throat. Cardiovascular:Positive for chest pain. Respiratory:  positiveor shortness of breath. Gastrointestinal: Negative for abdominal pain, vomiting and diarrhea. Genitourinary: Negative for dysuria. Musculoskeletal: Negative for back pain. Skin: Negative for rash. Neurological: Negative for headaches, focal weakness or numbness.  10-point ROS otherwise negative.  ____________________________________________   PHYSICAL EXAM:  VITAL SIGNS: ED Triage Vitals  Enc Vitals Group     BP 05/15/15 2257 107/49 mmHg     Pulse Rate 05/15/15 2257 69     Resp 05/15/15 2300 16     Temp 05/15/15 2257 98.1 F (36.7 C)     Temp Source 05/15/15 2257 Oral     SpO2 05/15/15 2257 96 %     Weight 05/15/15 2257 160 lb (72.576 kg)     Height 05/15/15 2257 5\' 10"  (1.778 m)     Head Cir --      Peak Flow --      Pain Score 05/15/15 2259 8     Pain Loc --      Pain Edu? --      Excl. in [place]? --      Constitutional: Alert and oriented. Well appearing and in no distress. Eyes: Conjunctivae are normal. PERRL. Normal extraocular movements. ENT   Head: Normocephalic and atraumatic.   Nose: No congestion/rhinnorhea.   Mouth/Throat: Mucous membranes are moist.   Neck: No stridor. Hematological/Lymphatic/Immunilogical: No cervical lymphadenopathy. Cardiovascular: Normal rate, regular rhythm. Normal and symmetric distal pulses are present in all  extremities. No murmurs, rubs, or gallops. Respiratory: Normal respiratory effort without tachypnea nor retractions. Breath sounds are clear and equal bilaterally. No wheezes/rales/rhonchi. Gastrointestinal: Soft and nontender. No distention. There is no CVA tenderness. Genitourinary: deferred Musculoskeletal: Nontender with normal range of motion in all extremities. No joint effusions.  No lower extremity tenderness nor edema. Neurologic:  Normal speech and language. No gross focal neurologic deficits are appreciated. Speech is normal.  Skin:  Skin is warm, dry and intact. No rash noted. multiple areas of bruising bilateral upper extremity left lower extremity.  Psychiatric: Mood and affect are normal. Speech and behavior are normal. Patient exhibits appropriate insight and judgment.  ____________________________________________    LABS (pertinent positives/negatives)  Labs Reviewed  BASIC METABOLIC PANEL - Abnormal; Notable for the following:    Glucose, Bld 107 (*)    BUN 45 (*)    Creatinine, Ser 2.06 (*)    Calcium 8.8 (*)    GFR calc non Af Amer 31 (*)    GFR calc Af Amer 35 (*)    All other components within normal limits  CBC - Abnormal; Notable for the following:    RBC 3.67 (*)    Hemoglobin 12.2 (*)    HCT 35.8 (*)    RDW 14.8 (*)    All other components within normal limits  TROPONIN I - Abnormal; Notable for the following:    Troponin I 0.06 (*)    All other components within normal limits  PROTIME-INR - Abnormal; Notable for the following:    Prothrombin Time 35.5 (*)    All other components within normal limits     ____________________________________________   EKG  ED ECG REPORT I, Kashus Karlen, [place] N, the attending physician, personally viewed and interpreted this ECG.   Date: 05/16/2015  EKG Time: 11:14 PM  Rate: 77  Rhythm: Atrial fibrillation  Axis: Normal  Intervals: irregular PR interval  ST&T Change: None     ____________________________________________    RADIOLOGY   FINDINGS: Single portable view of the chest does not demonstrate a focal consolidation. There is no pleural effusion or pneumothorax. The cardiac silhouette is within normal limits. There is degenerative changes of the spine. Lower cervical fixation plate and screws.  IMPRESSION: No active disease.  Critical Care performed: CRITICAL CARE Performed by: Marjean Donna N   Total critical care time: 30 minutes  Critical care time was exclusive of separately billable procedures and treating other patients.  Critical care was necessary to treat or prevent imminent or life-threatening deterioration.  Critical care was time spent personally by me on the following activities: development of treatment plan with patient and/or surrogate as well as nursing, discussions with consultants, evaluation of patient's response to treatment, examination of patient, obtaining history from patient or surrogate, ordering and performing treatments and interventions, ordering and review of laboratory studies, ordering and review of radiographic studies, pulse oximetry and re-evaluation of patient's condition.   ____________________________________________   INITIAL IMPRESSION / ASSESSMENT AND PLAN / ED COURSE  Pertinent labs & imaging results that were available during my care of the patient were reviewed by me and considered in my medical decision making (see chart for details).  Patient with history & physical exam concerning for cardiac etiology of chest pain accompanied by elevated troponin at 0.06. As such patient will be admitted to Dr. Darvin Neighbours for hospital admission for further management.  Patient received 2 doses of IV morphine without pain resolution. Patient states pain score after second dose of morphine was 7 out of 10. As such patient received 1 mg of IV Dilaudid ____________________________________________   FINAL  CLINICAL IMPRESSION(S) / ED DIAGNOSES  Final diagnoses:  Syncope, unspecified syncope type  NSTEMI (non-ST elevated myocardial infarction) ([place])  Chest pain, unspecified chest pain type      Gregor Hams, MD 05/16/15 0230

## 2015-05-16 NOTE — Progress Notes (Signed)
LAWAYNE FRAGOSO is a 75 y.o. male  [id number]  Primary Cardiologist: Neoma Laming Reason for Consultation: Chest pain  HPI: This is a 75 year old white male with a past medical history of atrial fibrillation and CHF presented to the emergency room with chest pain. Chest pain was pressure type associated with shortness of breath and diaphoresis and feeling of passing out. Patient has mildly elevated troponin thus I was asked to evaluate the patient   Review of Systems: No chest pain right now no orthopnea PND or palpitation   Past Medical History  Diagnosis Date  . Hypertension   . Heart failure ([place])   . Vertigo   . Leukemia ([place])     20 years ago  . Hyperlipidemia   . Enlarged prostate     Medications Prior to Admission  Medication Sig Dispense Refill  . acetaminophen (TYLENOL) 500 MG tablet Take 1,000 mg by mouth every 8 (eight) hours as needed for mild pain, fever or headache.    . albuterol (PROVENTIL HFA;VENTOLIN HFA) 108 (90 Base) MCG/ACT inhaler Inhale 2 puffs into the lungs every 4 (four) hours as needed for wheezing.    Marland Kitchen aspirin 81 MG tablet Take 81 mg by mouth daily.    . cyanocobalamin 500 MCG tablet Take 500 mcg by mouth daily.    . finasteride (PROSCAR) 5 MG tablet Take 5 mg by mouth daily.    . fluticasone (FLONASE) 50 MCG/ACT nasal spray Place 2 sprays into both nostrils daily as needed for allergies or rhinitis.    . Fluticasone-Salmeterol (ADVAIR) 250-50 MCG/DOSE AEPB Inhale 1 puff into the lungs 2 (two) times daily.    . folic acid (FOLVITE) 1 MG tablet Take 1 mg by mouth daily.    . furosemide (LASIX) 20 MG tablet Take 20 mg by mouth 2 (two) times daily.    Marland Kitchen gabapentin (NEURONTIN) 600 MG tablet Take 600 mg by mouth 3 (three) times daily.    Marland Kitchen HYDROcodone-acetaminophen (NORCO) 10-325 MG tablet Take 1 tablet by mouth every 6 (six) hours as needed for moderate pain or severe pain.    Marland Kitchen lisinopril (PRINIVIL,ZESTRIL) 10 MG tablet Take 10 mg by mouth daily.     Marland Kitchen loratadine (CLARITIN) 10 MG tablet Take 10 mg by mouth daily.    . magnesium oxide (MAG-OX) 400 MG tablet Take 400 mg by mouth daily.    . meclizine (ANTIVERT) 25 MG tablet Take 25 mg by mouth 3 (three) times daily as needed for dizziness.    . metoprolol tartrate (LOPRESSOR) 25 MG tablet Take 25 mg by mouth 2 (two) times daily.    . montelukast (SINGULAIR) 10 MG tablet Take 10 mg by mouth daily.    . Multiple Vitamin (MULTIVITAMIN WITH MINERALS) TABS tablet Take 1 tablet by mouth daily.    . nitroGLYCERIN (NITROSTAT) 0.4 MG SL tablet Place 0.4 mg under the tongue every 5 (five) minutes as needed for chest pain.    Marland Kitchen omeprazole (PRILOSEC) 20 MG capsule Take 20 mg by mouth daily.    Marland Kitchen pyridOXINE (VITAMIN B-6) 50 MG tablet Take 50 mg by mouth daily.    . simvastatin (ZOCOR) 20 MG tablet Take 20 mg by mouth daily.    . tamsulosin (FLOMAX) 0.4 MG CAPS capsule Take 0.4 mg by mouth daily.     Marland Kitchen warfarin (COUMADIN) 5 MG tablet Take 2.5-5 mg by mouth daily. Take one tablet (5mg ) daily Monday-Friday Take 1/2 tablet (2.5mg ) on Saturday and Sunday       .  aspirin  81 mg Oral Daily  . docusate sodium  100 mg Oral BID  . finasteride  5 mg Oral Daily  . furosemide  20 mg Oral BID  . gabapentin  600 mg Oral TID  . lisinopril  10 mg Oral Daily  . magnesium oxide  400 mg Oral Daily  . metoprolol succinate  25 mg Oral Daily  . simvastatin  20 mg Oral Daily  . sodium chloride flush  3 mL Intravenous Q12H  . tamsulosin  0.4 mg Oral QPC supper    Infusions: . sodium chloride 75 mL/hr at 05/16/15 0343    No Known Allergies  Social History   Social History  . Marital Status: Married    Spouse Name: N/A  . Number of Children: N/A  . Years of Education: N/A   Occupational History  . Not on file.   Social History Main Topics  . Smoking status: Never Smoker   . Smokeless tobacco: Not on file  . Alcohol Use: No  . Drug Use: No  . Sexual Activity: Yes   Other Topics Concern  . Not on  file   Social History Narrative   Patient is illiterate    History reviewed. No pertinent family history.  PHYSICAL EXAM: Filed Vitals:   05/16/15 0342 05/16/15 0721  BP: 134/88 120/73  Pulse: 82 52  Temp: 98.2 F (36.8 C) 98.2 F (36.8 C)  Resp: 22 16     Intake/Output Summary (Last 24 hours) at 05/16/15 0903 Last data filed at 05/16/15 0617  Gross per 24 hour  Intake  192.5 ml  Output    475 ml  Net -282.5 ml    General:  Well appearing. No respiratory difficulty HEENT: normal Neck: supple. no JVD. Carotids 2+ bilat; no bruits. No lymphadenopathy or thryomegaly appreciated. Cor: PMI nondisplaced. Regular rate & rhythm. No rubs, gallops or murmurs. Lungs: clear Abdomen: soft, nontender, nondistended. No hepatosplenomegaly. No bruits or masses. Good bowel sounds. Extremities: no cyanosis, clubbing, rash, edema Neuro: alert & oriented x 3, cranial nerves grossly intact. moves all 4 extremities w/o difficulty. Affect pleasant.  ECG: Atrial fibrillation about 100 bpm with occasional PVCs nonspecific ST-T changes  Results for orders placed or performed during the hospital encounter of 05/15/15 (from the past 24 hour(s))  Basic metabolic panel     Status: Abnormal   Collection Time: 05/15/15 11:24 PM  Result Value Ref Range   Sodium 138 135 - 145 mmol/L   Potassium 4.4 3.5 - 5.1 mmol/L   Chloride 106 101 - 111 mmol/L   CO2 26 22 - 32 mmol/L   Glucose, Bld 107 (H) 65 - 99 mg/dL   BUN 45 (H) 6 - 20 mg/dL   Creatinine, Ser 2.06 (H) 0.61 - 1.24 mg/dL   Calcium 8.8 (L) 8.9 - 10.3 mg/dL   GFR calc non Af Amer 31 (L) >60 mL/min   GFR calc Af Amer 35 (L) >60 mL/min   Anion gap 6 5 - 15  CBC     Status: Abnormal   Collection Time: 05/15/15 11:24 PM  Result Value Ref Range   WBC 8.1 3.8 - 10.6 K/uL   RBC 3.67 (L) 4.40 - 5.90 MIL/uL   Hemoglobin 12.2 (L) 13.0 - 18.0 g/dL   HCT 35.8 (L) 40.0 - 52.0 %   MCV 97.6 80.0 - 100.0 fL   MCH 33.2 26.0 - 34.0 pg   MCHC 34.1 32.0 -  36.0 g/dL   RDW 14.8 (H)  11.5 - 14.5 %   Platelets 184 150 - 440 K/uL  Troponin I     Status: Abnormal   Collection Time: 05/15/15 11:24 PM  Result Value Ref Range   Troponin I 0.06 (H) <0.031 ng/mL  Protime-INR     Status: Abnormal   Collection Time: 05/15/15 11:24 PM  Result Value Ref Range   Prothrombin Time 35.5 (H) 11.4 - 15.0 seconds   INR 3.65   TSH     Status: None   Collection Time: 05/15/15 11:24 PM  Result Value Ref Range   TSH 3.209 0.350 - 4.500 uIU/mL  Troponin I     Status: Abnormal   Collection Time: 05/16/15  3:02 AM  Result Value Ref Range   Troponin I 0.06 (H) <0.031 ng/mL  Troponin I     Status: Abnormal   Collection Time: 05/16/15  4:23 AM  Result Value Ref Range   Troponin I 0.06 (H) <0.031 ng/mL  Basic metabolic panel     Status: Abnormal   Collection Time: 05/16/15  4:23 AM  Result Value Ref Range   Sodium 138 135 - 145 mmol/L   Potassium 4.2 3.5 - 5.1 mmol/L   Chloride 104 101 - 111 mmol/L   CO2 27 22 - 32 mmol/L   Glucose, Bld 103 (H) 65 - 99 mg/dL   BUN 44 (H) 6 - 20 mg/dL   Creatinine, Ser 1.84 (H) 0.61 - 1.24 mg/dL   Calcium 8.6 (L) 8.9 - 10.3 mg/dL   GFR calc non Af Amer 35 (L) >60 mL/min   GFR calc Af Amer 41 (L) >60 mL/min   Anion gap 7 5 - 15   Dg Chest Port 1 View  05/15/2015  CLINICAL DATA:  75 year old male with chest pain EXAM: PORTABLE CHEST 1 VIEW COMPARISON:  None. FINDINGS: Single portable view of the chest does not demonstrate a focal consolidation. There is no pleural effusion or pneumothorax. The cardiac silhouette is within normal limits. There is degenerative changes of the spine. Lower cervical fixation plate and screws. IMPRESSION: No active disease. Electronically Signed   By: Anner Crete M.D.   On: 05/15/2015 23:43     ASSESSMENT AND PLAN: Patient had chest pain which sound like angina with mildly elevated troponin in no acute EKG changes. Advise adding isosorbide 30 mg once a day. Advise getting giving IV fluids as  patient has renal insufficiency and/prerenal azotemia. Presyncope may have been related to dehydration but in the setting of chest pain he probably has coronary artery disease but right now is not the time to do cardiac catheterization because of renal insufficiency. Will do outpatient S stress tests after patient is hydrated and discharged.  Malasha Kleppe A

## 2015-05-16 NOTE — H&P (Signed)
Dylan Faulkner is an 75 y.o. male.   Chief Complaint: Chest pain HPI: The patient presents to the emergency department complaining of chest pain. He had dried a trashcan across the street but became extremely fatigued on the way back. As he walked up his front steps he felt severe substernal chest pain as well as dizziness. He collapsed on the couch of his living room but never lost consciousness. He denies vomiting or diaphoresis. The patient admits to nausea. Pain was 7 out of 10 in severity and reduced to 3 out of 10 in severity following aspirin, nitroglycerin and multiple doses of morphine. He admits to feeling subjectively weaker since a recent treatment for pneumonia. 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 Laboratory evaluation revealed a mildly elevated troponin which prompted the emergency department staff to call for admission.  Past Medical History  Diagnosis Date  . Hypertension   . Heart failure ([place])   . Vertigo   . Leukemia ([place])     20 years ago  . Hyperlipidemia   . Enlarged prostate     Past Surgical History  Procedure Laterality Date  . Hernia repair    . Neck fusion    . Biceps tendon repair      History reviewed. No pertinent family history. Social History:  reports that he has never smoked. He does not have any smokeless tobacco history on file. He reports that he does not drink alcohol or use illicit drugs.  Allergies: No Known Allergies  Medications Prior to Admission  Medication Sig Dispense Refill  . acetaminophen (TYLENOL) 500 MG tablet Take 1,000 mg by mouth every 8 (eight) hours as needed for mild pain, fever or headache.    . albuterol (PROVENTIL HFA;VENTOLIN HFA) 108 (90 Base) MCG/ACT inhaler Inhale 2 puffs into the lungs every 4 (four) hours as needed for wheezing.    Marland Kitchen aspirin 81 MG tablet Take 81 mg by mouth daily.    . cyanocobalamin 500 MCG tablet Take  500 mcg by mouth daily.    . finasteride (PROSCAR) 5 MG tablet Take 5 mg by mouth daily.    . fluticasone (FLONASE) 50 MCG/ACT nasal spray Place 2 sprays into both nostrils daily as needed for allergies or rhinitis.    . Fluticasone-Salmeterol (ADVAIR) 250-50 MCG/DOSE AEPB Inhale 1 puff into the lungs 2 (two) times daily.    . folic acid (FOLVITE) 1 MG tablet Take 1 mg by mouth daily.    . furosemide (LASIX) 20 MG tablet Take 20 mg by mouth 2 (two) times daily.    Marland Kitchen gabapentin (NEURONTIN) 600 MG tablet Take 600 mg by mouth 3 (three) times daily.    Marland Kitchen HYDROcodone-acetaminophen (NORCO) 10-325 MG tablet Take 1 tablet by mouth every 6 (six) hours as needed for moderate pain or severe pain.    Marland Kitchen lisinopril (PRINIVIL,ZESTRIL) 10 MG tablet Take 10 mg by mouth daily.    Marland Kitchen loratadine (CLARITIN) 10 MG tablet Take 10 mg by mouth daily.    . magnesium oxide (MAG-OX) 400 MG tablet Take 400 mg by mouth daily.    . meclizine (ANTIVERT) 25 MG tablet Take 25 mg by mouth 3 (three) times daily as needed for dizziness.    . metoprolol tartrate (LOPRESSOR) 25 MG tablet Take 25 mg by mouth 2 (two) times daily.    . montelukast (SINGULAIR) 10 MG tablet Take 10 mg by mouth daily.    . Multiple Vitamin (  MULTIVITAMIN WITH MINERALS) TABS tablet Take 1 tablet by mouth daily.    . nitroGLYCERIN (NITROSTAT) 0.4 MG SL tablet Place 0.4 mg under the tongue every 5 (five) minutes as needed for chest pain.    Marland Kitchen omeprazole (PRILOSEC) 20 MG capsule Take 20 mg by mouth daily.    Marland Kitchen pyridOXINE (VITAMIN B-6) 50 MG tablet Take 50 mg by mouth daily.    . simvastatin (ZOCOR) 20 MG tablet Take 20 mg by mouth daily.    . tamsulosin (FLOMAX) 0.4 MG CAPS capsule Take 0.4 mg by mouth daily.     Marland Kitchen warfarin (COUMADIN) 5 MG tablet Take 2.5-5 mg by mouth daily. Take one tablet (68m) daily Monday-Friday Take 1/2 tablet (2.524m on Saturday and Sunday      Results for orders placed or performed during the hospital encounter of 05/15/15 (from the  past 48 hour(s))  Basic metabolic panel     Status: Abnormal   Collection Time: 05/15/15 11:24 PM  Result Value Ref Range   Sodium 138 135 - 145 mmol/L   Potassium 4.4 3.5 - 5.1 mmol/L   Chloride 106 101 - 111 mmol/L   CO2 26 22 - 32 mmol/L   Glucose, Bld 107 (H) 65 - 99 mg/dL   BUN 45 (H) 6 - 20 mg/dL   Creatinine, Ser 2.06 (H) 0.61 - 1.24 mg/dL   Calcium 8.8 (L) 8.9 - 10.3 mg/dL   GFR calc non Af Amer 31 (L) >60 mL/min   GFR calc Af Amer 35 (L) >60 mL/min    Comment: (NOTE) The eGFR has been calculated using the CKD EPI equation. This calculation has not been validated in all clinical situations. eGFR's persistently <60 mL/min signify possible Chronic Kidney Disease.    Anion gap 6 5 - 15  CBC     Status: Abnormal   Collection Time: 05/15/15 11:24 PM  Result Value Ref Range   WBC 8.1 3.8 - 10.6 K/uL   RBC 3.67 (L) 4.40 - 5.90 MIL/uL   Hemoglobin 12.2 (L) 13.0 - 18.0 g/dL   HCT 35.8 (L) 40.0 - 52.0 %   MCV 97.6 80.0 - 100.0 fL   MCH 33.2 26.0 - 34.0 pg   MCHC 34.1 32.0 - 36.0 g/dL   RDW 14.8 (H) 11.5 - 14.5 %   Platelets 184 150 - 440 K/uL  Troponin I     Status: Abnormal   Collection Time: 05/15/15 11:24 PM  Result Value Ref Range   Troponin I 0.06 (H) <0.031 ng/mL    Comment: READ BACK AND VERIFIED WITH JONAH ELLINGTON ON 05/16/15 AT 0007 [facility]      PERSISTENTLY INCREASED TROPONIN VALUES IN THE RANGE OF 0.04-0.49 ng/mL CAN BE SEEN IN:       -UNSTABLE ANGINA       -CONGESTIVE HEART FAILURE       -MYOCARDITIS       -CHEST TRAUMA       -ARRYHTHMIAS       -LATE PRESENTING MYOCARDIAL INFARCTION       -COPD   CLINICAL FOLLOW-UP RECOMMENDED.   Protime-INR     Status: Abnormal   Collection Time: 05/15/15 11:24 PM  Result Value Ref Range   Prothrombin Time 35.5 (H) 11.4 - 15.0 seconds   INR 3.65   Troponin I     Status: Abnormal   Collection Time: 05/16/15  3:02 AM  Result Value Ref Range   Troponin I 0.06 (H) <0.031 ng/mL    Comment:  PREVIOUS RESULT CALLED  TO JONAH ELLINGTON ON 05/16/15 AT 0007 [place]        PERSISTENTLY INCREASED TROPONIN VALUES IN THE RANGE OF 0.04-0.49 ng/mL CAN BE SEEN IN:       -UNSTABLE ANGINA       -CONGESTIVE HEART FAILURE       -MYOCARDITIS       -CHEST TRAUMA       -ARRYHTHMIAS       -LATE PRESENTING MYOCARDIAL INFARCTION       -COPD   CLINICAL FOLLOW-UP RECOMMENDED.    Dg Chest Port 1 View  05/15/2015  CLINICAL DATA:  75 year old male with chest pain EXAM: PORTABLE CHEST 1 VIEW COMPARISON:  None. FINDINGS: Single portable view of the chest does not demonstrate a focal consolidation. There is no pleural effusion or pneumothorax. The cardiac silhouette is within normal limits. There is degenerative changes of the spine. Lower cervical fixation plate and screws. IMPRESSION: No active disease. Electronically Signed   By: Anner Crete M.D.   On: 05/15/2015 23:43    Review of Systems  Constitutional: Negative for fever and chills.  HENT: Negative for sore throat and tinnitus.   Eyes: Negative for blurred vision and redness.  Respiratory: Negative for cough and shortness of breath.   Cardiovascular: Positive for chest pain. Negative for palpitations, orthopnea and PND.  Gastrointestinal: Positive for nausea. Negative for vomiting, abdominal pain and diarrhea.  Genitourinary: Negative for dysuria, urgency and frequency.  Musculoskeletal: Negative for myalgias and joint pain.  Skin: Negative for rash.       No lesions  Neurological: Positive for dizziness and weakness. Negative for speech change and focal weakness.  Endo/Heme/Allergies: Does not bruise/bleed easily.       No temperature intolerance  Psychiatric/Behavioral: Negative for depression and suicidal ideas.    Blood pressure 134/88, pulse 82, temperature 98.2 F (36.8 C), temperature source Oral, resp. rate 22, height _0  (1.778 m), weight 75.206 kg (165 lb 12.8 oz), SpO2 98 %. Physical Exam  Nursing note and vitals reviewed. Constitutional: He is  oriented to person, place, and time. He appears well-developed and well-nourished. No distress.  HENT:  Head: Normocephalic and atraumatic.  Mouth/Throat: Oropharynx is clear and moist.  Eyes: Conjunctivae and EOM are normal. Pupils are equal, round, and reactive to light. No scleral icterus.  Neck: Normal range of motion. Neck supple. No JVD present. No tracheal deviation present. No thyromegaly present.  Cardiovascular: Normal rate and normal heart sounds.  Exam reveals no gallop and no friction rub.   No murmur heard. Respiratory: Effort normal and breath sounds normal. No respiratory distress.  GI: Soft. Bowel sounds are normal. He exhibits no distension. There is no tenderness.  Genitourinary:  Deferred  Musculoskeletal: Normal range of motion. He exhibits edema (Posterior tibial).  Lymphadenopathy:    He has no cervical adenopathy.  Neurological: He is alert and oriented to person, place, and time. No cranial nerve deficit.  Skin: Skin is warm and dry. No rash noted. No erythema.  Multiple areas of ecchymosis  Psychiatric: His behavior is normal. Judgment and thought content normal.  Pleasant but depressed mood     Assessment/Plan This is a 75 year old Caucasian male admitted for chest pain. 1. Chest pain: Exertional, likely cardiac. The patient has a history of anginal pain. Last heart catheterization December 2016 showed no significant coronary artery disease. EKG does not show indication of myocardial ischemia, although the patient does have a mildly elevated troponin. We will follow his  cardiac enzymes. Continue to monitor telemetry and aspirin. It does not appear that the patient has syncopized. Rather, he seems to have collapsed from weakness and fatigue. Cardiology consulted. Continue aspirin 2. Congestive heart failure: Systolic; stable. The patient's lungs are clear. He has some nonpitting posttibial edema but denies cough with frothy sputum or orthopnea. LVEF 40%. Continue  Lasix per home regimen 3. Acute on chronic kidney injury: Possibly prerenal failure due to decreased cardiac output. Gentle hydration for now. 4. Atrial fibrillation: Rate controlled. Hold warfarin as patient's INR is supratherapeutic. 5. Essential hypertension: Continue lisinopril and metoprolol 6. BPH: Continue finasteride and Flomax 7. DVT prophylaxis: Therapeutic anticoagulation as above 8. GI prophylaxis: None The patient is a full code. Time spent on admission orders and patient care approximately 45 minutes  Harrie Foreman, MD 05/16/2015, 4:38 AM

## 2015-05-16 NOTE — Progress Notes (Signed)
Pt discharged to home via wc.  Instructions  given to pt.  Questions answered.  No distress.  

## 2015-05-16 NOTE — ED Notes (Signed)
Pt reports he is unable to read or write. He reports his wife knows his medical history, he is unsure of the details.

## 2015-08-07 ENCOUNTER — Emergency Department
Admission: EM | Admit: 2015-08-07 | Discharge: 2015-08-07 | Disposition: A | Discharge status: Home / Self Care | Payer: Medicare HMO | Attending: Emergency Medicine | Admitting: Emergency Medicine

## 2015-08-07 ENCOUNTER — Emergency Department: Payer: Medicare HMO

## 2015-08-07 ENCOUNTER — Encounter: Payer: Self-pay | Admitting: Emergency Medicine

## 2015-08-07 DIAGNOSIS — R7989 Other specified abnormal findings of blood chemistry: Secondary | ICD-10-CM | POA: Insufficient documentation

## 2015-08-07 DIAGNOSIS — Z79899 Other long term (current) drug therapy: Secondary | ICD-10-CM | POA: Diagnosis not present

## 2015-08-07 DIAGNOSIS — Z7982 Long term (current) use of aspirin: Secondary | ICD-10-CM | POA: Diagnosis not present

## 2015-08-07 DIAGNOSIS — M25531 Pain in right wrist: Secondary | ICD-10-CM | POA: Diagnosis not present

## 2015-08-07 DIAGNOSIS — I509 Heart failure, unspecified: Secondary | ICD-10-CM | POA: Diagnosis not present

## 2015-08-07 DIAGNOSIS — E785 Hyperlipidemia, unspecified: Secondary | ICD-10-CM | POA: Diagnosis not present

## 2015-08-07 DIAGNOSIS — E790 Hyperuricemia without signs of inflammatory arthritis and tophaceous disease: Secondary | ICD-10-CM

## 2015-08-07 DIAGNOSIS — I110 Hypertensive heart disease with heart failure: Secondary | ICD-10-CM | POA: Insufficient documentation

## 2015-08-07 LAB — COMPREHENSIVE METABOLIC PANEL
ALK PHOS: 49 U/L (ref 38–126)
ALT: 21 U/L (ref 17–63)
AST: 21 U/L (ref 15–41)
Albumin: 3.7 g/dL (ref 3.5–5.0)
Anion gap: 10 (ref 5–15)
BILIRUBIN TOTAL: 0.9 mg/dL (ref 0.3–1.2)
BUN: 28 mg/dL — ABNORMAL HIGH (ref 6–20)
CALCIUM: 8.8 mg/dL — AB (ref 8.9–10.3)
CO2: 24 mmol/L (ref 22–32)
CREATININE: 1.99 mg/dL — AB (ref 0.61–1.24)
Chloride: 103 mmol/L (ref 101–111)
GFR calc non Af Amer: 32 mL/min — ABNORMAL LOW (ref >60)
GFR, EST AFRICAN AMERICAN: 37 mL/min — AB (ref >60)
GLUCOSE: 102 mg/dL — AB (ref 65–99)
Potassium: 3.7 mmol/L (ref 3.5–5.1)
SODIUM: 137 mmol/L (ref 135–145)
TOTAL PROTEIN: 6.3 g/dL — AB (ref 6.5–8.1)

## 2015-08-07 LAB — CBC WITH DIFFERENTIAL/PLATELET
Basophils Absolute: 0.1 10*3/uL (ref 0–0.1)
Basophils Relative: 1 %
EOS ABS: 0.1 10*3/uL (ref 0–0.7)
Eosinophils Relative: 1 %
HEMATOCRIT: 35.3 % — AB (ref 40.0–52.0)
HEMOGLOBIN: 12 g/dL — AB (ref 13.0–18.0)
LYMPHS ABS: 1.9 10*3/uL (ref 1.0–3.6)
MCH: 34.2 pg — AB (ref 26.0–34.0)
MCHC: 34.1 g/dL (ref 32.0–36.0)
MCV: 100.2 fL — ABNORMAL HIGH (ref 80.0–100.0)
Monocytes Absolute: 1.8 10*3/uL — ABNORMAL HIGH (ref 0.2–1.0)
NEUTROS ABS: 6.9 10*3/uL — AB (ref 1.4–6.5)
Platelets: 223 10*3/uL (ref 150–440)
RBC: 3.52 MIL/uL — AB (ref 4.40–5.90)
RDW: 14.4 % (ref 11.5–14.5)
WBC: 10.9 10*3/uL — AB (ref 3.8–10.6)

## 2015-08-07 LAB — URIC ACID: Uric Acid, Serum: 9.8 mg/dL — ABNORMAL HIGH (ref 4.4–7.6)

## 2015-08-07 MED ORDER — PREDNISONE 10 MG (21) PO TBPK: ORAL_TABLET | ORAL | Status: DC

## 2015-08-07 MED ORDER — PREDNISONE 20 MG PO TABS
60.0000 mg | ORAL_TABLET | Freq: Once | ORAL | Status: AC
  Administered 2015-08-07: 60 mg via ORAL
  Filled 2015-08-07: qty 3

## 2015-08-07 NOTE — ED Provider Notes (Signed)
[facility] Emergency Department Provider Note  ____________________________________________  Time seen: Approximately 7:31 PM  I have reviewed the triage vital signs and the nursing notes.   HISTORY  Chief Complaint Wrist Pain    HPI Dylan Faulkner is a 75 y.o. male with history of hypertension, CVA, A. Fib, CHF on diuretics, and leukemia in remission who presents with 1 day of right wrist pain and no injury. No fevers or chills. No history of gout.   Past Medical History  Diagnosis Date  . Hypertension   . Heart failure ([place])   . Vertigo   . Leukemia ([place])     20 years ago  . Hyperlipidemia   . Enlarged prostate     Patient Active Problem List   Diagnosis Date Noted  . Syncope 05/16/2015    Past Surgical History  Procedure Laterality Date  . Hernia repair    . Neck fusion    . Biceps tendon repair      Current Outpatient Rx  Name  Route  Sig  Dispense  Refill  . acetaminophen (TYLENOL) 500 MG tablet   Oral   Take 1,000 mg by mouth every 8 (eight) hours as needed for mild pain, fever or headache.         . albuterol (PROVENTIL HFA;VENTOLIN HFA) 108 (90 Base) MCG/ACT inhaler   Inhalation   Inhale 2 puffs into the lungs every 4 (four) hours as needed for wheezing.         Marland Kitchen aspirin 81 MG tablet   Oral   Take 81 mg by mouth daily.         . cyanocobalamin 500 MCG tablet   Oral   Take 500 mcg by mouth daily.         . finasteride (PROSCAR) 5 MG tablet   Oral   Take 5 mg by mouth daily.         . fluticasone (FLONASE) 50 MCG/ACT nasal spray   Each Nare   Place 2 sprays into both nostrils daily as needed for allergies or rhinitis.         . Fluticasone-Salmeterol (ADVAIR) 250-50 MCG/DOSE AEPB   Inhalation   Inhale 1 puff into the lungs 2 (two) times daily.         . folic acid (FOLVITE) 1 MG tablet   Oral   Take 1 mg by mouth daily.         . furosemide (LASIX) 20 MG tablet   Oral   Take 20 mg by mouth  2 (two) times daily.         Marland Kitchen gabapentin (NEURONTIN) 600 MG tablet   Oral   Take 600 mg by mouth 3 (three) times daily.         Marland Kitchen HYDROcodone-acetaminophen (NORCO) 10-325 MG tablet   Oral   Take 1 tablet by mouth every 6 (six) hours as needed for moderate pain or severe pain.         . isosorbide mononitrate (IMDUR) 30 MG 24 hr tablet   Oral   Take 1 tablet (30 mg total) by mouth daily.   30 tablet   0   . lisinopril (PRINIVIL,ZESTRIL) 10 MG tablet   Oral   Take 10 mg by mouth daily.         Marland Kitchen loratadine (CLARITIN) 10 MG tablet   Oral   Take 10 mg by mouth daily.         . magnesium oxide (MAG-OX)  400 MG tablet   Oral   Take 400 mg by mouth daily.         . meclizine (ANTIVERT) 25 MG tablet   Oral   Take 25 mg by mouth 3 (three) times daily as needed for dizziness.         . metoprolol tartrate (LOPRESSOR) 25 MG tablet   Oral   Take 25 mg by mouth 2 (two) times daily.         . montelukast (SINGULAIR) 10 MG tablet   Oral   Take 10 mg by mouth daily.         . Multiple Vitamin (MULTIVITAMIN WITH MINERALS) TABS tablet   Oral   Take 1 tablet by mouth daily.         . nitroGLYCERIN (NITROSTAT) 0.4 MG SL tablet   Sublingual   Place 0.4 mg under the tongue every 5 (five) minutes as needed for chest pain.         Marland Kitchen omeprazole (PRILOSEC) 20 MG capsule   Oral   Take 20 mg by mouth daily.         . predniSONE (STERAPRED UNI-PAK 21 TAB) 10 MG (21) TBPK tablet      6 tablets on day 1, 5 tablets on day 2, 4 tablets on day 3, etc...   21 tablet   0   . pyridOXINE (VITAMIN B-6) 50 MG tablet   Oral   Take 50 mg by mouth daily.         . simvastatin (ZOCOR) 20 MG tablet   Oral   Take 20 mg by mouth daily.         . tamsulosin (FLOMAX) 0.4 MG CAPS capsule   Oral   Take 0.4 mg by mouth daily.          Marland Kitchen warfarin (COUMADIN) 5 MG tablet   Oral   Take 2.5-5 mg by mouth daily. Take one tablet (5mg ) daily Monday-Friday Take 1/2 tablet  (2.5mg ) on Saturday and Sunday           Allergies Review of patient's allergies indicates no known allergies.  No family history on file.  Social History Social History  Substance Use Topics  . Smoking status: Never Smoker   . Smokeless tobacco: None  . Alcohol Use: No    Review of Systems Constitutional: No fever/chills Cardiovascular: Denies chest pain. Respiratory: Denies shortness of breath. Gastrointestinal: No abdominal pain.  No nausea, no vomiting.  No diarrhea.  No constipation. Genitourinary: Negative for dysuria. Musculoskeletal: per HPI Skin: Negative for rash. Neurological: Negative for headaches, focal weakness or numbness. 10-point ROS otherwise negative.  ____________________________________________   PHYSICAL EXAM:  VITAL SIGNS: ED Triage Vitals  Enc Vitals Group     BP 08/07/15 1908 132/65 mmHg     Pulse Rate 08/07/15 1908 81     Resp 08/07/15 1908 17     Temp 08/07/15 1908 98.2 F (36.8 C)     Temp Source 08/07/15 1908 Oral     SpO2 08/07/15 1908 97 %     Weight 08/07/15 1908 160 lb (72.576 kg)     Height 08/07/15 1908 5\' 10"  (1.778 m)     Head Cir --      Peak Flow --      Pain Score 08/07/15 1917 10     Pain Loc --      Pain Edu? --      Excl. in [place]? --     Constitutional:  Alert and oriented. Well appearing and in no acute distress. Eyes: Conjunctivae are normal. EOMI. Ears:  Clear with normal landmarks. No erythema. Head: Atraumatic. Neck:  Supple Cardiovascular: Normal rate, regular rhythm. Grossly normal heart sounds.  Good peripheral circulation. Respiratory: Normal respiratory effort.  No retractions. Lungs CTAB.  Musculoskeletal: Right wrist: Tender over the joint, limited by range of motion without clear swelling or erythema. Neurologic:  Normal speech and language. No gross focal neurologic deficits are appreciated. No gait instability. Skin:  Skin is warm, dry and intact. No rash noted. Psychiatric: Mood and affect are  normal. Speech and behavior are normal.  ____________________________________________   LABS (all labs ordered are listed, but only abnormal results are displayed)  Labs Reviewed  CBC WITH DIFFERENTIAL/PLATELET - Abnormal; Notable for the following:    WBC 10.9 (*)    RBC 3.52 (*)    Hemoglobin 12.0 (*)    HCT 35.3 (*)    MCV 100.2 (*)    MCH 34.2 (*)    Neutro Abs 6.9 (*)    Monocytes Absolute 1.8 (*)    All other components within normal limits  COMPREHENSIVE METABOLIC PANEL - Abnormal; Notable for the following:    Glucose, Bld 102 (*)    BUN 28 (*)    Creatinine, Ser 1.99 (*)    Calcium 8.8 (*)    Total Protein 6.3 (*)    GFR calc non Af Amer 32 (*)    GFR calc Af Amer 37 (*)    All other components within normal limits  URIC ACID - Abnormal; Notable for the following:    Uric Acid, Serum 9.8 (*)    All other components within normal limits   ____________________________________________  EKG   ____________________________________________  RADIOLOGY  CLINICAL DATA: Pt sts that he developed R wrist pain today w/o injury. Pt able to move all finger. Pt sts wrist hurts to turn. no prior hx of injuries.  EXAM: RIGHT WRIST - COMPLETE 3+ VIEW  COMPARISON: None.  FINDINGS: Mild calcification of the triangular fibrocartilage. No fracture or dislocation. Tiny cyst in the distal pole of the scaphoid. Vascular calcification at the level of the wrist joint. Mild soft tissue swelling diffusely. Mild degenerative change distal radial ulnar joint and radiocarpal joint.  IMPRESSION: Nonacute findings and nonspecific soft tissue swelling.   Electronically Signed  By: Skipper Cliche M.D.  On: 08/07/2015 19:54 ____________________________________________   PROCEDURES  Procedure(s) performed: None  Critical Care performed: No  ____________________________________________   INITIAL IMPRESSION / ASSESSMENT AND PLAN / ED COURSE  Pertinent labs &  imaging results that were available during my care of the patient were reviewed by me and considered in my medical decision making (see chart for details).  75 year old with acute onset of right wrist pain, without injury. Uric acid level elevated in the emergency room. No prior history of gout. Given prednisone 6 mg 1. Then given a prednisone taper. Encouraged follow-up with his primary physician. Return to [facility] room for worsening symptoms. Labs reveal stable anemia, chronic kidney disease. Handout given on gout. ____________________________________________   FINAL CLINICAL IMPRESSION(S) / ED DIAGNOSES  Final diagnoses:  Wrist pain, acute, right  Elevated uric acid in blood      Mortimer Fries, PA-C 08/07/15 2045  Nance Pear, MD 08/07/15 2158

## 2015-08-07 NOTE — ED Notes (Signed)
Pt sts that he developed R wrist pain today w/o injury.  Pt able to move all finger, sensation intact and pulses palpable.  Pt sts wrist hurts to turn.  NAD.

## 2015-08-07 NOTE — Discharge Instructions (Signed)
Take prednisone as directed. Recommend following up with your primary physician or her consulting orthopedist if not improving. Return to the emergency room for any concerns.

## 2015-08-07 NOTE — ED Notes (Signed)
Developed right wrist since noon today  W/o injury

## 2015-10-22 ENCOUNTER — Emergency Department: Payer: Medicare HMO

## 2015-10-22 ENCOUNTER — Observation Stay
Admission: EM | Admit: 2015-10-22 | Discharge: 2015-10-24 | Disposition: A | Discharge status: Home / Self Care | Payer: Medicare HMO | Attending: Internal Medicine | Admitting: Internal Medicine

## 2015-10-22 ENCOUNTER — Observation Stay: Payer: Medicare HMO

## 2015-10-22 ENCOUNTER — Encounter: Payer: Self-pay | Admitting: Emergency Medicine

## 2015-10-22 ENCOUNTER — Inpatient Hospital Stay
Admit: 2015-10-22 | Discharge: 2015-10-22 | Disposition: A | Discharge status: Home / Self Care | Payer: Medicare HMO | Attending: Internal Medicine | Admitting: Internal Medicine

## 2015-10-22 DIAGNOSIS — Z8673 Personal history of transient ischemic attack (TIA), and cerebral infarction without residual deficits: Secondary | ICD-10-CM | POA: Insufficient documentation

## 2015-10-22 DIAGNOSIS — Z7951 Long term (current) use of inhaled steroids: Secondary | ICD-10-CM | POA: Diagnosis not present

## 2015-10-22 DIAGNOSIS — I672 Cerebral atherosclerosis: Secondary | ICD-10-CM | POA: Diagnosis not present

## 2015-10-22 DIAGNOSIS — E860 Dehydration: Secondary | ICD-10-CM | POA: Diagnosis present

## 2015-10-22 DIAGNOSIS — S0990XA Unspecified injury of head, initial encounter: Secondary | ICD-10-CM

## 2015-10-22 DIAGNOSIS — R55 Syncope and collapse: Secondary | ICD-10-CM | POA: Diagnosis not present

## 2015-10-22 DIAGNOSIS — Z7982 Long term (current) use of aspirin: Secondary | ICD-10-CM | POA: Insufficient documentation

## 2015-10-22 DIAGNOSIS — I2510 Atherosclerotic heart disease of native coronary artery without angina pectoris: Secondary | ICD-10-CM | POA: Diagnosis not present

## 2015-10-22 DIAGNOSIS — W109XXA Fall (on) (from) unspecified stairs and steps, initial encounter: Secondary | ICD-10-CM | POA: Diagnosis not present

## 2015-10-22 DIAGNOSIS — Z856 Personal history of leukemia: Secondary | ICD-10-CM | POA: Insufficient documentation

## 2015-10-22 DIAGNOSIS — N281 Cyst of kidney, acquired: Secondary | ICD-10-CM | POA: Diagnosis not present

## 2015-10-22 DIAGNOSIS — S0003XA Contusion of scalp, initial encounter: Secondary | ICD-10-CM | POA: Diagnosis not present

## 2015-10-22 DIAGNOSIS — Z7952 Long term (current) use of systemic steroids: Secondary | ICD-10-CM | POA: Diagnosis not present

## 2015-10-22 DIAGNOSIS — S42101A Fracture of unspecified part of scapula, right shoulder, initial encounter for closed fracture: Secondary | ICD-10-CM | POA: Insufficient documentation

## 2015-10-22 DIAGNOSIS — Z79899 Other long term (current) drug therapy: Secondary | ICD-10-CM | POA: Insufficient documentation

## 2015-10-22 DIAGNOSIS — I6523 Occlusion and stenosis of bilateral carotid arteries: Secondary | ICD-10-CM | POA: Diagnosis not present

## 2015-10-22 DIAGNOSIS — N400 Enlarged prostate without lower urinary tract symptoms: Secondary | ICD-10-CM | POA: Insufficient documentation

## 2015-10-22 DIAGNOSIS — S42101B Fracture of unspecified part of scapula, right shoulder, initial encounter for open fracture: Secondary | ICD-10-CM

## 2015-10-22 DIAGNOSIS — I482 Chronic atrial fibrillation: Secondary | ICD-10-CM | POA: Insufficient documentation

## 2015-10-22 DIAGNOSIS — Z7901 Long term (current) use of anticoagulants: Secondary | ICD-10-CM | POA: Insufficient documentation

## 2015-10-22 DIAGNOSIS — Z9889 Other specified postprocedural states: Secondary | ICD-10-CM | POA: Insufficient documentation

## 2015-10-22 DIAGNOSIS — I110 Hypertensive heart disease with heart failure: Secondary | ICD-10-CM | POA: Diagnosis not present

## 2015-10-22 DIAGNOSIS — E785 Hyperlipidemia, unspecified: Secondary | ICD-10-CM | POA: Insufficient documentation

## 2015-10-22 DIAGNOSIS — Z981 Arthrodesis status: Secondary | ICD-10-CM | POA: Diagnosis not present

## 2015-10-22 DIAGNOSIS — Z8249 Family history of ischemic heart disease and other diseases of the circulatory system: Secondary | ICD-10-CM | POA: Insufficient documentation

## 2015-10-22 HISTORY — DX: Unspecified atrial fibrillation: I48.91

## 2015-10-22 LAB — COMPREHENSIVE METABOLIC PANEL
ALT: 20 U/L (ref 17–63)
ANION GAP: 9 (ref 5–15)
AST: 25 U/L (ref 15–41)
Albumin: 3.4 g/dL — ABNORMAL LOW (ref 3.5–5.0)
Alkaline Phosphatase: 59 U/L (ref 38–126)
BILIRUBIN TOTAL: 1 mg/dL (ref 0.3–1.2)
BUN: 24 mg/dL — AB (ref 6–20)
CALCIUM: 9 mg/dL (ref 8.9–10.3)
CO2: 26 mmol/L (ref 22–32)
Chloride: 106 mmol/L (ref 101–111)
Creatinine, Ser: 2.1 mg/dL — ABNORMAL HIGH (ref 0.61–1.24)
GFR calc Af Amer: 34 mL/min — ABNORMAL LOW (ref >60)
GFR, EST NON AFRICAN AMERICAN: 30 mL/min — AB (ref >60)
GLUCOSE: 90 mg/dL (ref 65–99)
Potassium: 3.8 mmol/L (ref 3.5–5.1)
Sodium: 141 mmol/L (ref 135–145)
TOTAL PROTEIN: 6.2 g/dL — AB (ref 6.5–8.1)

## 2015-10-22 LAB — URINALYSIS COMPLETE WITH MICROSCOPIC (ARMC ONLY)
BACTERIA UA: NONE SEEN
Bilirubin Urine: NEGATIVE
GLUCOSE, UA: NEGATIVE mg/dL
HGB URINE DIPSTICK: NEGATIVE
Ketones, ur: NEGATIVE mg/dL
LEUKOCYTES UA: NEGATIVE
NITRITE: NEGATIVE
PH: 7 (ref 5.0–8.0)
PROTEIN: NEGATIVE mg/dL
RBC / HPF: NONE SEEN RBC/hpf (ref 0–5)
SPECIFIC GRAVITY, URINE: 1.006 (ref 1.005–1.030)
Squamous Epithelial / LPF: NONE SEEN

## 2015-10-22 LAB — PROTIME-INR
INR: 3.09
Prothrombin Time: 31.3 seconds — ABNORMAL HIGH (ref 11.4–15.0)

## 2015-10-22 LAB — CBC WITH DIFFERENTIAL/PLATELET
Basophils Absolute: 0.1 10*3/uL (ref 0–0.1)
Basophils Relative: 1 %
EOS PCT: 1 %
Eosinophils Absolute: 0.1 10*3/uL (ref 0–0.7)
HEMATOCRIT: 34.9 % — AB (ref 40.0–52.0)
Hemoglobin: 12.2 g/dL — ABNORMAL LOW (ref 13.0–18.0)
LYMPHS ABS: 1.5 10*3/uL (ref 1.0–3.6)
LYMPHS PCT: 15 %
MCH: 34.1 pg — AB (ref 26.0–34.0)
MCHC: 34.8 g/dL (ref 32.0–36.0)
MCV: 97.9 fL (ref 80.0–100.0)
MONO ABS: 1.1 10*3/uL — AB (ref 0.2–1.0)
MONOS PCT: 11 %
NEUTROS ABS: 7.1 10*3/uL — AB (ref 1.4–6.5)
Neutrophils Relative %: 72 %
PLATELETS: 222 10*3/uL (ref 150–440)
RBC: 3.57 MIL/uL — ABNORMAL LOW (ref 4.40–5.90)
RDW: 13.6 % (ref 11.5–14.5)
WBC: 9.8 10*3/uL (ref 3.8–10.6)

## 2015-10-22 LAB — TROPONIN I: Troponin I: 0.03 ng/mL (ref <0.03)

## 2015-10-22 LAB — CK: CK TOTAL: 243 U/L (ref 49–397)

## 2015-10-22 MED ORDER — SODIUM CHLORIDE 0.9 % IV SOLN
INTRAVENOUS | Status: DC
  Administered 2015-10-22 – 2015-10-24 (×4): via INTRAVENOUS

## 2015-10-22 MED ORDER — WARFARIN SODIUM 2.5 MG PO TABS: 2.5000 mg | ORAL_TABLET | Freq: Every day | ORAL | Status: DC

## 2015-10-22 MED ORDER — HYDROCODONE-ACETAMINOPHEN 10-325 MG PO TABS
ORAL_TABLET | ORAL | Status: AC
  Administered 2015-10-22: 1 via ORAL
  Filled 2015-10-22: qty 1

## 2015-10-22 MED ORDER — NITROGLYCERIN 0.4 MG SL SUBL: 0.4000 mg | SUBLINGUAL_TABLET | SUBLINGUAL | Status: DC | PRN

## 2015-10-22 MED ORDER — ONDANSETRON HCL 4 MG/2ML IJ SOLN
INTRAMUSCULAR | Status: AC
  Administered 2015-10-22: 4 mg via INTRAVENOUS
  Filled 2015-10-22: qty 2

## 2015-10-22 MED ORDER — FINASTERIDE 5 MG PO TABS
5.0000 mg | ORAL_TABLET | Freq: Every day | ORAL | Status: DC
  Administered 2015-10-23 – 2015-10-24 (×2): 5 mg via ORAL
  Filled 2015-10-22 (×2): qty 1

## 2015-10-22 MED ORDER — METOPROLOL TARTRATE 25 MG PO TABS
25.0000 mg | ORAL_TABLET | Freq: Two times a day (BID) | ORAL | Status: DC
  Administered 2015-10-22 – 2015-10-24 (×3): 25 mg via ORAL
  Filled 2015-10-22 (×4): qty 1

## 2015-10-22 MED ORDER — FOLIC ACID 1 MG PO TABS
1.0000 mg | ORAL_TABLET | Freq: Every day | ORAL | Status: DC
  Administered 2015-10-23 – 2015-10-24 (×2): 1 mg via ORAL
  Filled 2015-10-22 (×2): qty 1

## 2015-10-22 MED ORDER — ONDANSETRON HCL 4 MG PO TABS: 4.0000 mg | ORAL_TABLET | Freq: Four times a day (QID) | ORAL | Status: DC | PRN

## 2015-10-22 MED ORDER — VITAMIN B-12 1000 MCG PO TABS
500.0000 ug | ORAL_TABLET | Freq: Every day | ORAL | Status: DC
  Administered 2015-10-23 – 2015-10-24 (×2): 500 ug via ORAL
  Filled 2015-10-22 (×2): qty 1

## 2015-10-22 MED ORDER — SODIUM CHLORIDE 0.9% FLUSH
3.0000 mL | Freq: Two times a day (BID) | INTRAVENOUS | Status: DC
  Administered 2015-10-22: 3 mL via INTRAVENOUS

## 2015-10-22 MED ORDER — HYDROCODONE-ACETAMINOPHEN 10-325 MG PO TABS
1.0000 | ORAL_TABLET | Freq: Four times a day (QID) | ORAL | Status: DC | PRN
  Administered 2015-10-22 – 2015-10-24 (×6): 1 via ORAL
  Filled 2015-10-22 (×5): qty 1

## 2015-10-22 MED ORDER — ISOSORBIDE MONONITRATE ER 30 MG PO TB24
30.0000 mg | ORAL_TABLET | Freq: Every day | ORAL | Status: DC
  Administered 2015-10-23 – 2015-10-24 (×2): 30 mg via ORAL
  Filled 2015-10-22 (×2): qty 1

## 2015-10-22 MED ORDER — MONTELUKAST SODIUM 10 MG PO TABS
10.0000 mg | ORAL_TABLET | Freq: Every day | ORAL | Status: DC
  Administered 2015-10-23 – 2015-10-24 (×2): 10 mg via ORAL
  Filled 2015-10-22 (×2): qty 1

## 2015-10-22 MED ORDER — TAMSULOSIN HCL 0.4 MG PO CAPS
0.4000 mg | ORAL_CAPSULE | Freq: Every day | ORAL | Status: DC
  Administered 2015-10-23 – 2015-10-24 (×2): 0.4 mg via ORAL
  Filled 2015-10-22 (×2): qty 1

## 2015-10-22 MED ORDER — ACETAMINOPHEN 650 MG RE SUPP: 650.0000 mg | Freq: Four times a day (QID) | RECTAL | Status: DC | PRN

## 2015-10-22 MED ORDER — ALBUTEROL SULFATE (2.5 MG/3ML) 0.083% IN NEBU: 2.5000 mg | INHALATION_SOLUTION | RESPIRATORY_TRACT | Status: DC | PRN

## 2015-10-22 MED ORDER — GABAPENTIN 600 MG PO TABS
600.0000 mg | ORAL_TABLET | Freq: Three times a day (TID) | ORAL | Status: DC
  Administered 2015-10-22 – 2015-10-23 (×2): 600 mg via ORAL
  Filled 2015-10-22 (×2): qty 1

## 2015-10-22 MED ORDER — LORATADINE 10 MG PO TABS
10.0000 mg | ORAL_TABLET | Freq: Every day | ORAL | Status: DC
  Administered 2015-10-23 – 2015-10-24 (×2): 10 mg via ORAL
  Filled 2015-10-22 (×2): qty 1

## 2015-10-22 MED ORDER — SIMVASTATIN 40 MG PO TABS
20.0000 mg | ORAL_TABLET | Freq: Every day | ORAL | Status: DC
  Administered 2015-10-23 – 2015-10-24 (×2): 20 mg via ORAL
  Filled 2015-10-22 (×2): qty 1

## 2015-10-22 MED ORDER — FLUTICASONE PROPIONATE 50 MCG/ACT NA SUSP
2.0000 | Freq: Every day | NASAL | Status: DC | PRN
  Filled 2015-10-22: qty 16

## 2015-10-22 MED ORDER — ACETAMINOPHEN 325 MG PO TABS: 650.0000 mg | ORAL_TABLET | Freq: Four times a day (QID) | ORAL | Status: DC | PRN

## 2015-10-22 MED ORDER — MAGNESIUM OXIDE 400 (241.3 MG) MG PO TABS
400.0000 mg | ORAL_TABLET | Freq: Every day | ORAL | Status: DC
  Administered 2015-10-23 – 2015-10-24 (×2): 400 mg via ORAL
  Filled 2015-10-22 (×2): qty 1

## 2015-10-22 MED ORDER — LISINOPRIL 10 MG PO TABS
10.0000 mg | ORAL_TABLET | Freq: Every day | ORAL | Status: DC
  Administered 2015-10-23 – 2015-10-24 (×2): 10 mg via ORAL
  Filled 2015-10-22 (×2): qty 1

## 2015-10-22 MED ORDER — ASPIRIN EC 81 MG PO TBEC
81.0000 mg | DELAYED_RELEASE_TABLET | Freq: Every day | ORAL | Status: DC
  Administered 2015-10-23 – 2015-10-24 (×2): 81 mg via ORAL
  Filled 2015-10-22 (×2): qty 1

## 2015-10-22 MED ORDER — MOMETASONE FURO-FORMOTEROL FUM 200-5 MCG/ACT IN AERO
2.0000 | INHALATION_SPRAY | Freq: Two times a day (BID) | RESPIRATORY_TRACT | Status: DC
  Administered 2015-10-22 – 2015-10-24 (×4): 2 via RESPIRATORY_TRACT
  Filled 2015-10-22: qty 8.8

## 2015-10-22 MED ORDER — ADULT MULTIVITAMIN W/MINERALS CH
1.0000 | ORAL_TABLET | Freq: Every day | ORAL | Status: DC
  Administered 2015-10-23 – 2015-10-24 (×2): 1 via ORAL
  Filled 2015-10-22 (×2): qty 1

## 2015-10-22 MED ORDER — SODIUM CHLORIDE 0.9 % IV SOLN
Freq: Once | INTRAVENOUS | Status: AC
  Administered 2015-10-22: 13:00:00 via INTRAVENOUS

## 2015-10-22 MED ORDER — ONDANSETRON HCL 4 MG/2ML IJ SOLN
4.0000 mg | Freq: Four times a day (QID) | INTRAMUSCULAR | Status: DC | PRN
  Administered 2015-10-22: 4 mg via INTRAVENOUS

## 2015-10-22 MED ORDER — MECLIZINE HCL 25 MG PO TABS: 25.0000 mg | ORAL_TABLET | Freq: Three times a day (TID) | ORAL | Status: DC | PRN

## 2015-10-22 MED ORDER — ACETAMINOPHEN 500 MG PO TABS
1000.0000 mg | ORAL_TABLET | Freq: Three times a day (TID) | ORAL | Status: DC | PRN
  Administered 2015-10-23: 1000 mg via ORAL
  Filled 2015-10-22: qty 2

## 2015-10-22 MED ORDER — VITAMIN B-6 50 MG PO TABS
50.0000 mg | ORAL_TABLET | Freq: Every day | ORAL | Status: DC
  Administered 2015-10-23 – 2015-10-24 (×2): 50 mg via ORAL
  Filled 2015-10-22 (×2): qty 1

## 2015-10-22 MED ORDER — PANTOPRAZOLE SODIUM 40 MG PO TBEC
40.0000 mg | DELAYED_RELEASE_TABLET | Freq: Every day | ORAL | Status: DC
  Administered 2015-10-23 – 2015-10-24 (×2): 40 mg via ORAL
  Filled 2015-10-22 (×2): qty 1

## 2015-10-22 NOTE — ED Notes (Signed)
Patient transported to radiology

## 2015-10-22 NOTE — ED Provider Notes (Signed)
[facility] Emergency Department Provider Note        Time seen: ----------------------------------------- 12:47 PM on 10/22/2015 -----------------------------------------    I have reviewed the triage vital signs and the nursing notes.   HISTORY  Chief Complaint Near Syncope    HPI Dylan Faulkner is a 75 y.o. male who presents to the ER for near syncopal event. Patient states he was weed eating outside, got lightheaded and may have passed out. Patient is brought in by EMS profusely diaphoretic, there is concerns for head injury as well.Patient currently takes Coumadin, states he has had a history of syncope in the past. His complaints right now consist of right shoulder pain otherwise states he feels fine now. Syncope occurred just prior to arrival.   Past Medical History  Diagnosis Date  . Hypertension   . Heart failure ([place])   . Vertigo   . Leukemia ([place])     20 years ago  . Hyperlipidemia   . Enlarged prostate     Patient Active Problem List   Diagnosis Date Noted  . Syncope 05/16/2015    Past Surgical History  Procedure Laterality Date  . Hernia repair    . Neck fusion    . Biceps tendon repair      Allergies Review of patient's allergies indicates no known allergies.  Social History Social History  Substance Use Topics  . Smoking status: Never Smoker   . Smokeless tobacco: Not on file  . Alcohol Use: No    Review of Systems Constitutional: Negative for fever. Cardiovascular: Negative for chest pain. Respiratory: Negative for shortness of breath. Gastrointestinal: Negative for abdominal pain, vomiting and diarrhea. Genitourinary: Negative for dysuria. Musculoskeletal: Positive right shoulder pain Skin: Negative for rash. Neurological: Positive for head injury, weakness  10-point ROS otherwise negative.  ____________________________________________   PHYSICAL EXAM:  VITAL SIGNS: ED Triage Vitals  Enc Vitals  Group     BP --      Pulse --      Resp --      Temp --      Temp src --      SpO2 --      Weight --      Height --      Head Cir --      Peak Flow --      Pain Score --      Pain Loc --      Pain Edu? --      Excl. in [place]? --     Constitutional: Alert and oriented. Well appearing and in no distress. Eyes: Conjunctivae are normal. PERRL. Normal extraocular movements. ENT   Head: Normocephalic, less than 1 cm laceration noted to the right occipital scalp   Nose: No congestion/rhinnorhea.   Mouth/Throat: Mucous membranes are moist.   Neck: No stridor. Cardiovascular: Normal rate, regular rhythm. No murmurs, rubs, or gallops. Respiratory: Normal respiratory effort without tachypnea nor retractions. Breath sounds are clear and equal bilaterally. No wheezes/rales/rhonchi. Gastrointestinal: Soft and nontender. Normal bowel sounds Musculoskeletal: Nontender with normal range of motion in all extremities. No lower extremity tenderness nor edema. Neurologic:  Normal speech and language. No gross focal neurologic deficits are appreciated.  Skin:  Skin is warm, dry and intact. Profuse diaphoresis Psychiatric: Mood and affect are normal. Speech and behavior are normal.  ____________________________________________  EKG: Interpreted by me. Atrial fibrillation with a rate of 79 normal QRS, normal QT interval. Normal axis. No evidence of acute infarction  ____________________________________________  ED COURSE:  Pertinent labs & imaging results that were available during my care of the patient were reviewed by me and considered in my medical decision making (see chart for details). Patient presents to the ER after a likely heat related illness and dehydration. He'll receive IV fluids, we will check basic labs. ____________________________________________    LABS (pertinent positives/negatives)  Labs Reviewed  CBC WITH DIFFERENTIAL/PLATELET - Abnormal; Notable for the  following:    RBC 3.57 (*)    Hemoglobin 12.2 (*)    HCT 34.9 (*)    MCH 34.1 (*)    Neutro Abs 7.1 (*)    Monocytes Absolute 1.1 (*)    All other components within normal limits  COMPREHENSIVE METABOLIC PANEL - Abnormal; Notable for the following:    BUN 24 (*)    Creatinine, Ser 2.10 (*)    Total Protein 6.2 (*)    Albumin 3.4 (*)    GFR calc non Af Amer 30 (*)    GFR calc Af Amer 34 (*)    All other components within normal limits  TROPONIN I - Abnormal; Notable for the following:    Troponin I 0.03 (*)    All other components within normal limits  URINALYSIS COMPLETEWITH MICROSCOPIC ([facility] ONLY) - Abnormal; Notable for the following:    Color, Urine STRAW (*)    APPearance CLEAR (*)    All other components within normal limits  PROTIME-INR - Abnormal; Notable for the following:    Prothrombin Time 31.3 (*)    All other components within normal limits  CK    RADIOLOGY Images were viewed by me  CT head IMPRESSION: Comminuted scapular fracture. IMPRESSION: 1. Soft tissue swelling about the right posterior parietal/occipital calvarium without associated radiopaque foreign body, displaced calvarial fracture or acute intracranial process. 2. Similar findings of atrophy and microvascular ischemic disease without acute intracranial process. ____________________________________________  FINAL ASSESSMENT AND PLAN  Heat related illness, syncope, dehydration, Scapular fracture  Plan: Patient with labs and imaging as dictated above. Patient presented to the ER after syncopal event that appear to be due to dehydration and heat exhaustion. He did sustain a mild head injury,Also sustaining a scapular fracture. Family is requesting admission in the hospital for observation. I will discuss with the hospitalist.   Earleen Newport, MD   Note: This dictation was prepared with Dragon dictation. Any transcriptional errors that result from this process are  unintentional   Earleen Newport, MD 10/22/15 [phone]

## 2015-10-22 NOTE — Progress Notes (Signed)
ANTICOAGULATION CONSULT NOTE - Initial Consult  Pharmacy Consult for Warfarin  Indication: atrial fibrillation  No Known Allergies  Patient Measurements: Height: 5\' 10"  (177.8 cm) Weight: 154 lb 6.4 oz (70.035 kg) IBW/kg (Calculated) : 73 Heparin Dosing Weight:   Vital Signs: Temp: 98.9 F (37.2 C) (07/19 1853) Temp Source: Oral (07/19 1853) BP: 128/79 mmHg (07/19 1853) Pulse Rate: 78 (07/19 1853)  Labs:  Recent Labs  10/22/15 1255  HGB 12.2*  HCT 34.9*  PLT 222  LABPROT 31.3*  INR 3.09  CREATININE 2.10*  CKTOTAL 243  TROPONINI 0.03*    Estimated Creatinine Clearance: 31 mL/min (by C-G formula based on Cr of 2.1).   Medical History: Past Medical History  Diagnosis Date  . Hypertension   . Heart failure ([place])   . Vertigo   . Leukemia ([place])     20 years ago  . Hyperlipidemia   . Enlarged prostate   . A-fib ([place])     Medications:  Prescriptions prior to admission  Medication Sig Dispense Refill Last Dose  . acetaminophen (TYLENOL) 500 MG tablet Take 500 mg by mouth once as needed for headache.    Past Week at Unknown time  . albuterol (PROVENTIL HFA;VENTOLIN HFA) 108 (90 Base) MCG/ACT inhaler Inhale 2 puffs into the lungs every 4 (four) hours as needed for wheezing.   10/22/2015 at Unknown time  . aspirin EC 81 MG tablet Take 81 mg by mouth daily.   10/22/2015 at Unknown time  . cyanocobalamin 500 MCG tablet Take 500 mcg by mouth daily.   05/15/2015 at Unknown time  . diazepam (VALIUM) 2 MG tablet Take 2 mg by mouth at bedtime.   10/21/2015 at Unknown time  . finasteride (PROSCAR) 5 MG tablet Take 5 mg by mouth daily.   10/22/2015 at Unknown time  . Fluticasone-Salmeterol (ADVAIR) 250-50 MCG/DOSE AEPB Inhale 1 puff into the lungs 2 (two) times daily.   10/22/2015 at Unknown time  . folic acid (FOLVITE) 1 MG tablet Take 1 mg by mouth daily.   10/22/2015 at Unknown time  . furosemide (LASIX) 20 MG tablet Take 20 mg by mouth 2 (two) times daily.   10/22/2015 at Unknown  time  . gabapentin (NEURONTIN) 600 MG tablet Take 600 mg by mouth 3 (three) times daily.   10/22/2015 at Unknown time  . HYDROcodone-acetaminophen (NORCO) 10-325 MG tablet Take 1 tablet by mouth every 6 (six) hours as needed for moderate pain or severe pain.   10/22/2015 at Unknown time  . isosorbide mononitrate (IMDUR) 30 MG 24 hr tablet Take 1 tablet (30 mg total) by mouth daily. 30 tablet 0 10/22/2015 at Unknown time  . lisinopril (PRINIVIL,ZESTRIL) 10 MG tablet Take 10 mg by mouth daily.   10/22/2015 at Unknown time  . loratadine (CLARITIN) 10 MG tablet Take 10 mg by mouth daily.   10/22/2015 at Unknown time  . magnesium oxide (MAG-OX) 400 MG tablet Take 400 mg by mouth daily.   10/22/2015 at Unknown time  . meclizine (ANTIVERT) 25 MG tablet Take 25 mg by mouth 3 (three) times daily as needed for dizziness.   10/22/2015 at Unknown time  . montelukast (SINGULAIR) 10 MG tablet Take 10 mg by mouth daily.   10/22/2015 at Unknown time  . Multiple Vitamin (MULTIVITAMIN WITH MINERALS) TABS tablet Take 1 tablet by mouth daily.   10/22/2015 at Unknown time  . omeprazole (PRILOSEC) 20 MG capsule Take 20 mg by mouth daily.   10/22/2015 at Unknown time  .  pyridOXINE (VITAMIN B-6) 50 MG tablet Take 50 mg by mouth daily.   10/22/2015 at Unknown time  . sertraline (ZOLOFT) 50 MG tablet Take 50 mg by mouth daily.   10/21/2015 at Unknown time  . simvastatin (ZOCOR) 20 MG tablet Take 20 mg by mouth daily.   10/22/2015 at Unknown time  . tamsulosin (FLOMAX) 0.4 MG CAPS capsule Take 0.4 mg by mouth daily.    10/22/2015 at Unknown time  . warfarin (COUMADIN) 5 MG tablet Take 5 mg by mouth daily.    10/22/2015 at Unknown time  . fluticasone (FLONASE) 50 MCG/ACT nasal spray Place 2 sprays into both nostrils daily as needed for allergies or rhinitis.   prn at prn  . nitroGLYCERIN (NITROSTAT) 0.4 MG SL tablet Place 0.4 mg under the tongue every 5 (five) minutes as needed for chest pain.   prn at prn    Assessment: 7/19:  INR =  3.09   Goal of Therapy:  INR 2-3   Plan:  Will hold warfarin and recheck INR on 7/20 with AM labs. Pt is also at risk for falling; MD may want to hold anticoagulation until this is resolved.   Dylan Faulkner 10/22/2015,7:15 PM

## 2015-10-22 NOTE — H&P (Signed)
[facility] NAME: Dylan Faulkner    MR#:  [id number]  DATE OF BIRTH:  Jun 30, 1940  DATE OF ADMISSION:  10/22/2015  PRIMARY CARE PHYSICIAN: Marygrace Drought, MD   REQUESTING/REFERRING PHYSICIAN:   CHIEF COMPLAINT:   Chief Complaint  Patient presents with  . Near Syncope    HISTORY OF PRESENT ILLNESS: Dylan Faulkner  is a 75 y.o. male with a known history of Hypertension, vertigo, BPH, hyperlipidemia who presents with syncope. Patient is a very poor historian. He apparently was weeding the grass when he went inside got some glass of water and then was going back out when he had an episode where he syncopized. He hit his back on the concrete. EMS was called. Patient is on chronic Coumadin for atrial fibrillation. He was complaining of pain in his right arm x-ray showed comminuted scapular fracture. He is currently complaining of pain in the arm but no chest pain or shortness of breath no dizziness.        PAST MEDICAL HISTORY:   Past Medical History  Diagnosis Date  . Hypertension   . Heart failure ([place])   . Vertigo   . 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 Hyperlipidemia   . Enlarged prostate   . A-fib [facility])     PAST SURGICAL HISTORY: Past Surgical History  Procedure Laterality Date  . Hernia repair    . Neck fusion    . Biceps tendon repair      SOCIAL HISTORY:  Social History  Substance Use Topics  . Smoking status: Never Smoker   . Smokeless tobacco: Not on file  . Alcohol Use: No    FAMILY HISTORY:  Family History  Problem Relation Age of Onset  . Hypertension      DRUG ALLERGIES: No Known Allergies  REVIEW OF SYSTEMS:   CONSTITUTIONAL: No fever, fatigue or weakness.  EYES: No blurred or double vision.  EARS, NOSE, AND THROAT: No tinnitus or ear pain.  RESPIRATORY: No cough, shortness of breath, wheezing or hemoptysis.  CARDIOVASCULAR: No chest pain, orthopnea, edema.  GASTROINTESTINAL: No  nausea, vomiting, diarrhea or abdominal pain.  GENITOURINARY: No dysuria, hematuria.  ENDOCRINE: No polyuria, nocturia,  HEMATOLOGY: No anemia, easy bruising or bleeding SKIN: No rash or lesion. MUSCULOSKELETAL:Right arm pain NEUROLOGIC: No tingling, numbness, weakness.  PSYCHIATRY: No anxiety or depression.   MEDICATIONS AT HOME:  Prior to Admission medications   Medication Sig Start Date End Date Taking? Authorizing Provider  acetaminophen (TYLENOL) 500 MG tablet Take 1,000 mg by mouth every 8 (eight) hours as needed for mild pain, fever or headache.    Historical Provider, MD  albuterol (PROVENTIL HFA;VENTOLIN HFA) 108 (90 Base) MCG/ACT inhaler Inhale 2 puffs into the lungs every 4 (four) hours as needed for wheezing.    Historical Provider, MD  aspirin 81 MG tablet Take 81 mg by mouth daily.    Historical Provider, MD  cyanocobalamin 500 MCG tablet Take 500 mcg by mouth daily.    Historical Provider, MD  finasteride (PROSCAR) 5 MG tablet Take 5 mg by mouth daily.    Historical Provider, MD  fluticasone (FLONASE) 50 MCG/ACT nasal spray Place 2 sprays into both nostrils daily as needed for allergies or rhinitis.    Historical Provider, MD  Fluticasone-Salmeterol (ADVAIR) 250-50 MCG/DOSE AEPB Inhale 1 puff into the lungs 2 (two) times daily.    Historical Provider, MD  folic acid (FOLVITE) 1 MG  tablet Take 1 mg by mouth daily.    Historical Provider, MD  furosemide (LASIX) 20 MG tablet Take 20 mg by mouth 2 (two) times daily.    Historical Provider, MD  gabapentin (NEURONTIN) 600 MG tablet Take 600 mg by mouth 3 (three) times daily.    Historical Provider, MD  HYDROcodone-acetaminophen (NORCO) 10-325 MG tablet Take 1 tablet by mouth every 6 (six) hours as needed for moderate pain or severe pain.    Historical Provider, MD  isosorbide mononitrate (IMDUR) 30 MG 24 hr tablet Take 1 tablet (30 mg total) by mouth daily. 05/16/15   Bettey Costa, MD  lisinopril (PRINIVIL,ZESTRIL) 10 MG tablet Take 10  mg by mouth daily.    Historical Provider, MD  loratadine (CLARITIN) 10 MG tablet Take 10 mg by mouth daily.    Historical Provider, MD  magnesium oxide (MAG-OX) 400 MG tablet Take 400 mg by mouth daily.    Historical Provider, MD  meclizine (ANTIVERT) 25 MG tablet Take 25 mg by mouth 3 (three) times daily as needed for dizziness.    Historical Provider, MD  metoprolol tartrate (LOPRESSOR) 25 MG tablet Take 25 mg by mouth 2 (two) times daily.    Historical Provider, MD  montelukast (SINGULAIR) 10 MG tablet Take 10 mg by mouth daily.    Historical Provider, MD  Multiple Vitamin (MULTIVITAMIN WITH MINERALS) TABS tablet Take 1 tablet by mouth daily.    Historical Provider, MD  nitroGLYCERIN (NITROSTAT) 0.4 MG SL tablet Place 0.4 mg under the tongue every 5 (five) minutes as needed for chest pain.    Historical Provider, MD  omeprazole (PRILOSEC) 20 MG capsule Take 20 mg by mouth daily.    Historical Provider, MD  predniSONE (STERAPRED UNI-PAK 21 TAB) 10 MG (21) TBPK tablet 6 tablets on day 1, 5 tablets on day 2, 4 tablets on day 3, etc... 08/07/15   Mortimer Fries, PA-C  pyridOXINE (VITAMIN B-6) 50 MG tablet Take 50 mg by mouth daily.    Historical Provider, MD  simvastatin (ZOCOR) 20 MG tablet Take 20 mg by mouth daily.    Historical Provider, MD  tamsulosin (FLOMAX) 0.4 MG CAPS capsule Take 0.4 mg by mouth daily.     Historical Provider, MD  warfarin (COUMADIN) 5 MG tablet Take 2.5-5 mg by mouth daily. Take one tablet (5mg ) daily Monday-Friday Take 1/2 tablet (2.5mg ) on Saturday and Sunday    Historical Provider, MD      PHYSICAL EXAMINATION:   VITAL SIGNS: Blood pressure 112/76, pulse 83, temperature 98.1 F (36.7 C), resp. rate 14, height 5\' 10"  (1.778 m), weight 74.844 kg (165 lb), SpO2 96 %.  GENERAL:  75 y.o.-year-old patient lying in the bed with no acute distress.  EYES: Pupils equal, round, reactive to light and accommodation. No scleral icterus. Extraocular muscles intact.  HEENT: Head  atraumatic, normocephalic. Oropharynx and nasopharynx clear.  NECK:  Supple, no jugular venous distention. No thyroid enlargement, no tenderness.  LUNGS: Normal breath sounds bilaterally, no wheezing, rales,rhonchi or crepitation. No use of accessory muscles of respiration.  CARDIOVASCULAR: S1, S2 normal. No murmurs, rubs, or gallops.  ABDOMEN: Soft, nontender, nondistended. Bowel sounds present. No organomegaly or mass.  EXTREMITIES: No pedal edema, cyanosis, or clubbing.  NEUROLOGIC: Cranial nerves II through XII are intact. Muscle strength 5/5 in all extremities. Sensation intact. Gait not checked.  PSYCHIATRIC: The patient is alert and oriented x 3.  SKIN: No obvious rash, lesion, or ulcer.   LABORATORY PANEL:   CBC  Recent Labs Lab 10/22/15 1255  WBC 9.8  HGB 12.2*  HCT 34.9*  PLT 222  MCV 97.9  MCH 34.1*  MCHC 34.8  RDW 13.6  LYMPHSABS 1.5  MONOABS 1.1*  EOSABS 0.1  BASOSABS 0.1   ------------------------------------------------------------------------------------------------------------------  Chemistries   Recent Labs Lab 10/22/15 1255  NA 141  K 3.8  CL 106  CO2 26  GLUCOSE 90  BUN 24*  CREATININE 2.10*  CALCIUM 9.0  AST 25  ALT 20  ALKPHOS 59  BILITOT 1.0   ------------------------------------------------------------------------------------------------------------------ estimated creatinine clearance is 32.3 mL/min (by C-G formula based on Cr of 2.1). ------------------------------------------------------------------------------------------------------------------ No results for input(s): TSH, T4TOTAL, T3FREE, THYROIDAB in the last 72 hours.  Invalid input(s): FREET3   Coagulation profile  Recent Labs Lab 10/22/15 1255  INR 3.09   ------------------------------------------------------------------------------------------------------------------- No results for input(s): DDIMER in the last 72  hours. -------------------------------------------------------------------------------------------------------------------  Cardiac Enzymes  Recent Labs Lab 10/22/15 1255  TROPONINI 0.03*   ------------------------------------------------------------------------------------------------------------------ Invalid input(s): POCBNP  ---------------------------------------------------------------------------------------------------------------  Urinalysis    Component Value Date/Time   COLORURINE STRAW* 10/22/2015 1345   COLORURINE Yellow 04/21/2014 1449   APPEARANCEUR CLEAR* 10/22/2015 1345   APPEARANCEUR Clear 04/21/2014 1449   LABSPEC 1.006 10/22/2015 1345   LABSPEC 1.009 04/21/2014 1449   PHURINE 7.0 10/22/2015 1345   PHURINE 6.0 04/21/2014 1449   GLUCOSEU NEGATIVE 10/22/2015 1345   GLUCOSEU Negative 04/21/2014 1449   HGBUR NEGATIVE 10/22/2015 1345   HGBUR Negative 04/21/2014 1449   BILIRUBINUR NEGATIVE 10/22/2015 1345   BILIRUBINUR Negative 04/21/2014 1449   KETONESUR NEGATIVE 10/22/2015 1345   KETONESUR Negative 04/21/2014 1449   PROTEINUR NEGATIVE 10/22/2015 1345   PROTEINUR Negative 04/21/2014 1449   NITRITE NEGATIVE 10/22/2015 1345   NITRITE Negative 04/21/2014 1449   LEUKOCYTESUR NEGATIVE 10/22/2015 1345   LEUKOCYTESUR Negative 04/21/2014 1449     RADIOLOGY: Dg Shoulder Right  10/22/2015  CLINICAL DATA:  Patient fell this morning, possibly after syncopal episode. Posterior shoulder pain with swelling and bruising. EXAM: RIGHT SHOULDER - 2+ VIEW COMPARISON:  None. FINDINGS: Bones are diffusely demineralized. Three views study shows a scapular fracture with coracoid process isolated is free fragment. IMPRESSION: Comminuted scapular fracture. Electronically Signed   By: Misty Stanley M.D.   On: 10/22/2015 13:47   Ct Head Wo Contrast  10/22/2015  CLINICAL DATA:  Syncopal episode, now with headache injury. EXAM: CT HEAD WITHOUT CONTRAST TECHNIQUE: Contiguous axial  images were obtained from the base of the skull through the vertex without intravenous contrast. COMPARISON:  04/21/2014 FINDINGS: Brain: There is soft tissue swelling about the right side of the posterior parietal / occipital calvarium (representative image 16, series 2; sagittal image 14, series 6). No associated radiopaque foreign body or displaced calvarial fracture. Similar findings of mild atrophy with sulcal prominence centralized volume loss. Rather extensive periventricular hypodensities compatible microvascular ischemic disease, similar to the 04/2014 examination. Given background parenchymal abnormalities, there is no CT evidence superimposed acute large territory infarct. No intraparenchymal or extra-axial mass or hemorrhage. Normal size and configuration of the ventricles and basilar cisterns. No midline shift. Vascular: Intracranial atherosclerosis. Skull: No displaced calvarial fracture Sinuses/Orbits: There is under pneumatization of the left mastoid air cells, unchanged. Remaining paranasal sinuses and right mastoid air cells are normally aerated. No air-fluid levels. Other: None. IMPRESSION: 1. Soft tissue swelling about the right posterior parietal/occipital calvarium without associated radiopaque foreign body, displaced calvarial fracture or acute intracranial process. 2. Similar findings of atrophy and microvascular ischemic disease without acute intracranial process. Electronically  Signed   By: Sandi Mariscal M.D.   On: 10/22/2015 13:46   Ct Chest Wo Contrast  10/22/2015  CLINICAL DATA:  Fall backwards, right posterior scalp hematoma. Also right shoulder and neck pain. Right scapula fracture. EXAM: CT CHEST WITHOUT CONTRAST TECHNIQUE: Multidetector CT imaging of the chest was performed following the standard protocol without IV contrast. COMPARISON:  10/22/2015 FINDINGS: Mediastinum/Lymph Nodes: Atherosclerosis of the major branch vessels and the aorta without significant aneurysm. Coronary  atherosclerosis noted. Normal heart size. No pericardial effusion. No adenopathy within the limits of noncontrast imaging. Intravenous air within the right jugular vein, and also the right atrial appendage from peripheral IV access. Lungs/Pleura: Minor dependent lower lobe atelectasis versus scarring. No focal airspace process, collapse or consolidation. No interstitial process, edema or pneumothorax. No pleural effusion or pleural abnormality. Trachea central airways remain patent. Upper abdomen: Abdominal atherosclerosis noted. Incidental 2 cm left upper pole renal cyst. No acute upper abdominal finding. Musculoskeletal: Acute displaced right scapular fracture along the scapular spine extending into the coracoid process and also the acromion. Fracture does not involve the glenoid fossa. There is surrounding hemorrhage in the right shoulder musculature. Clavicle intact. No adjacent displaced rib fracture. Minor thoracic spine degenerative change. No thoracic compression fracture. Sternum intact. IMPRESSION: Acute displaced right scapular fracture. Surrounding intramuscular hematoma. No other acute intra thoracic process Thoracic aortic and coronary atherosclerosis Electronically Signed   By: Jerilynn Mages.  Shick M.D.   On: 10/22/2015 16:09    EKG: Orders placed or performed during the hospital encounter of 05/15/15  . ED EKG  . ED EKG  . EKG 12-Lead  . EKG 12-Lead    IMPRESSION AND PLAN: Patient is a 75 year old presenting with syncope  1. Syncope with collapse Will admit patient for observation Place on telemetry We'll obtain echocardiogram of the heart and carotid Dopplers  2. Atrial fibrillation Continued therapy with metoprolol Continue Coumadin for now need to consider stopping this due to fall risk  3. Communated scapula fracture will ask or so to see likely conservative management with a sling  4. Hypertension continue current therapy with lisinopril M D[place] and metoprolol  5. Hyperlipidemia  continue simvastatin  6. BPH continue Flomax  7. Miscellaneous Coumadin for DVT prophylaxis All the records are reviewed and case discussed with ED provider. Management plans discussed with the patient, family and they are in agreement.  CODE STATUS:    Code Status Orders        Start     Ordered   10/22/15 1630  Full code   Continuous     10/22/15 1630    Code Status History    Date Active Date Inactive Code Status Order ID Comments User Context   05/16/2015  3:42 AM 05/16/2015  3:49 PM Full Code [id number]  Harrie Foreman, MD Inpatient       TOTAL TIME TAKING CARE OF THIS PATIENT: 55 minutes.    Dustin Flock M.D on 10/22/2015 at 4:33 PM  Between 7am to 6pm - Pager - [phone]  After 6pm go to [email or web address] - password [email or web address]  Office  [phone]  CC: Primary care physician; Marygrace Drought, MD

## 2015-10-22 NOTE — ED Notes (Signed)
Per [facility] patient comes from home. Patient was doing some yard work today. He was attempting to walk inside. While on the 3rd step back fell backwards and hit his head on some bricks that were lying on the ground. Patient denies LOC. Patient A&O x4 at this time. Hematoma noted to back of head. Bleeding controlled at this time. Patient takes blood thinners, unable to state which one. Patient denies CP. Does c/o right shoulder pain and neck pain 10/10. Obvious deformity noted to right shoulder with bruising. EMS gave 50 mcg of Fentanyl and stated a 500cc bolus of NS.

## 2015-10-23 LAB — CBC
HCT: 33.4 % — ABNORMAL LOW (ref 40.0–52.0)
Hemoglobin: 11.5 g/dL — ABNORMAL LOW (ref 13.0–18.0)
MCH: 34.6 pg — ABNORMAL HIGH (ref 26.0–34.0)
MCHC: 34.6 g/dL (ref 32.0–36.0)
MCV: 100 fL (ref 80.0–100.0)
PLATELETS: 185 10*3/uL (ref 150–440)
RBC: 3.34 MIL/uL — AB (ref 4.40–5.90)
RDW: 13.8 % (ref 11.5–14.5)
WBC: 9.2 10*3/uL (ref 3.8–10.6)

## 2015-10-23 LAB — PROTIME-INR
INR: 3.46
PROTHROMBIN TIME: 34.1 s — AB (ref 11.4–15.0)

## 2015-10-23 LAB — ECHOCARDIOGRAM COMPLETE
HEIGHTINCHES: 70 in
Weight: 2470.4 oz

## 2015-10-23 LAB — BASIC METABOLIC PANEL
ANION GAP: 6 (ref 5–15)
BUN: 31 mg/dL — ABNORMAL HIGH (ref 6–20)
CALCIUM: 8.3 mg/dL — AB (ref 8.9–10.3)
CO2: 25 mmol/L (ref 22–32)
Chloride: 105 mmol/L (ref 101–111)
Creatinine, Ser: 1.97 mg/dL — ABNORMAL HIGH (ref 0.61–1.24)
GFR, EST AFRICAN AMERICAN: 37 mL/min — AB (ref >60)
GFR, EST NON AFRICAN AMERICAN: 32 mL/min — AB (ref >60)
Glucose, Bld: 140 mg/dL — ABNORMAL HIGH (ref 65–99)
Potassium: 4.2 mmol/L (ref 3.5–5.1)
Sodium: 136 mmol/L (ref 135–145)

## 2015-10-23 MED ORDER — GABAPENTIN 600 MG PO TABS
600.0000 mg | ORAL_TABLET | Freq: Two times a day (BID) | ORAL | Status: DC
  Administered 2015-10-23 – 2015-10-24 (×2): 600 mg via ORAL
  Filled 2015-10-23 (×2): qty 1

## 2015-10-23 NOTE — Progress Notes (Signed)
CSW was informed by RN that patient and his family wanted to see CSW. When CSW spoke to family they reported they wanted to home assistance. CSW informed them that the [facility] them with home needs. Family thanked CSW for her assistance. CSW informed RNCM of above.   Ernest Pine, MSW, [place], [place] Clinical Social Worker [phone]

## 2015-10-23 NOTE — Progress Notes (Signed)
[facility] NAME: Dylan Faulkner    MRN#:  [id number]  DATE OF BIRTH:  Nov 24, 1940  SUBJECTIVE:  Hospital Day: none Dylan Faulkner is a 75 y.o. male presenting with Near Syncope .   Overnight events: No overnight events Interval Events: Still complains of pain right shoulder particularly with movement, no palpitations chest pain further symptoms  REVIEW OF SYSTEMS:  CONSTITUTIONAL: No fever, fatigue or weakness.  EYES: No blurred or double vision.  EARS, NOSE, AND THROAT: No tinnitus or ear pain.  RESPIRATORY: No cough, shortness of breath, wheezing or hemoptysis.  CARDIOVASCULAR: No chest pain, orthopnea, edema.  GASTROINTESTINAL: No nausea, vomiting, diarrhea or abdominal pain.  GENITOURINARY: No dysuria, hematuria.  ENDOCRINE: No polyuria, nocturia,  HEMATOLOGY: No anemia, easy bruising or bleeding SKIN: No rash or lesion. MUSCULOSKELETAL: Right shoulder pain as above NoOther joint pain or arthritis.   NEUROLOGIC: No tingling, numbness, weakness.  PSYCHIATRY: No anxiety or depression.   DRUG ALLERGIES:  No Known Allergies  VITALS:  Blood pressure 100/60, pulse 52, temperature 98.5 F (36.9 C), temperature source Oral, resp. rate 19, height 5\' 10"  (1.778 m), weight 154 lb 6.4 oz (70.035 kg), SpO2 95 %.  PHYSICAL EXAMINATION:  VITAL SIGNS: Filed Vitals:   10/23/15 0725 10/23/15 1204  BP: 102/68 100/60  Pulse: 66 52  Temp:  98.5 F (36.9 C)  Resp: 43 19   GENERAL:75 y.o.male currently in no acute distress.  HEAD: Normocephalic, atraumatic.  EYES: Pupils equal, round, reactive to light. Extraocular muscles intact. No scleral icterus.  MOUTH: Moist mucosal membrane. Dentition intact. No abscess noted.  EAR, NOSE, THROAT: Clear without exudates. No external lesions.  NECK: Supple. No thyromegaly. No nodules. No JVD.  PULMONARY: Clear to ascultation, without wheeze rails or rhonci. No use of accessory muscles,  Good respiratory effort. good air entry bilaterally CHEST: Nontender to palpation.  CARDIOVASCULAR: S1 and S2. iregular rate and rhythm. No murmurs, rubs, or gallops. No edema. Pedal pulses 2+ bilaterally.  GASTROINTESTINAL: Soft, nontender, nondistended. No masses. Positive bowel sounds. No hepatosplenomegaly.  MUSCULOSKELETAL: No swelling, clubbing, or edema. Range of motion limited right upper arm given fracture, arm in sling NEUROLOGIC: Cranial nerves II through XII are intact. No gross focal neurological deficits. Sensation intact. Reflexes intact.  SKIN: Small ulceration occipital portion of scalp no bleeding, no further lesions, rashes, or cyanosis. Skin warm and dry. Turgor intact.  PSYCHIATRIC: Mood, affect within normal limits. The patient is awake, alert and oriented x 3. Insight, judgment intact.      LABORATORY PANEL:   CBC  Recent Labs Lab 10/23/15 0452  WBC 9.2  HGB 11.5*  HCT 33.4*  PLT 185   ------------------------------------------------------------------------------------------------------------------  Chemistries   Recent Labs Lab 10/22/15 1255 10/23/15 0452  NA 141 136  K 3.8 4.2  CL 106 105  CO2 26 25  GLUCOSE 90 140*  BUN 24* 31*  CREATININE 2.10* 1.97*  CALCIUM 9.0 8.3*  AST 25  --   ALT 20  --   ALKPHOS 59  --   BILITOT 1.0  --    ------------------------------------------------------------------------------------------------------------------  Cardiac Enzymes  Recent Labs Lab 10/22/15 1255  TROPONINI 0.03*   ------------------------------------------------------------------------------------------------------------------  RADIOLOGY:  Dg Shoulder Right  10/22/2015  CLINICAL DATA:  Patient fell this morning, possibly after syncopal episode. Posterior shoulder pain with swelling and bruising. EXAM: RIGHT SHOULDER - 2+ VIEW COMPARISON:  None. FINDINGS: Bones are diffusely demineralized. Three views study shows a scapular  fracture with  coracoid process isolated is free fragment. IMPRESSION: Comminuted scapular fracture. Electronically Signed   By: Misty Stanley M.D.   On: 10/22/2015 13:47   Ct Head Wo Contrast  10/22/2015  CLINICAL DATA:  Syncopal episode, now with headache injury. EXAM: CT HEAD WITHOUT CONTRAST TECHNIQUE: Contiguous axial images were obtained from the base of the skull through the vertex without intravenous contrast. COMPARISON:  04/21/2014 FINDINGS: Brain: There is soft tissue swelling about the right side of the posterior parietal / occipital calvarium (representative image 16, series 2; sagittal image 14, series 6). No associated radiopaque foreign body or displaced calvarial fracture. Similar findings of mild atrophy with sulcal prominence centralized volume loss. Rather extensive periventricular hypodensities compatible microvascular ischemic disease, similar to the 04/2014 examination. Given background parenchymal abnormalities, there is no CT evidence superimposed acute large territory infarct. No intraparenchymal or extra-axial mass or hemorrhage. Normal size and configuration of the ventricles and basilar cisterns. No midline shift. Vascular: Intracranial atherosclerosis. Skull: No displaced calvarial fracture Sinuses/Orbits: There is under pneumatization of the left mastoid air cells, unchanged. Remaining paranasal sinuses and right mastoid air cells are normally aerated. No air-fluid levels. Other: None. IMPRESSION: 1. Soft tissue swelling about the right posterior parietal/occipital calvarium without associated radiopaque foreign body, displaced calvarial fracture or acute intracranial process. 2. Similar findings of atrophy and microvascular ischemic disease without acute intracranial process. Electronically Signed   By: Sandi Mariscal M.D.   On: 10/22/2015 13:46   Ct Chest Wo Contrast  10/22/2015  CLINICAL DATA:  Fall backwards, right posterior scalp hematoma. Also right shoulder and neck pain. Right scapula  fracture. EXAM: CT CHEST WITHOUT CONTRAST TECHNIQUE: Multidetector CT imaging of the chest was performed following the standard protocol without IV contrast. COMPARISON:  10/22/2015 FINDINGS: Mediastinum/Lymph Nodes: Atherosclerosis of the major branch vessels and the aorta without significant aneurysm. Coronary atherosclerosis noted. Normal heart size. No pericardial effusion. No adenopathy within the limits of noncontrast imaging. Intravenous air within the right jugular vein, and also the right atrial appendage from peripheral IV access. Lungs/Pleura: Minor dependent lower lobe atelectasis versus scarring. No focal airspace process, collapse or consolidation. No interstitial process, edema or pneumothorax. No pleural effusion or pleural abnormality. Trachea central airways remain patent. Upper abdomen: Abdominal atherosclerosis noted. Incidental 2 cm left upper pole renal cyst. No acute upper abdominal finding. Musculoskeletal: Acute displaced right scapular fracture along the scapular spine extending into the coracoid process and also the acromion. Fracture does not involve the glenoid fossa. There is surrounding hemorrhage in the right shoulder musculature. Clavicle intact. No adjacent displaced rib fracture. Minor thoracic spine degenerative change. No thoracic compression fracture. Sternum intact. IMPRESSION: Acute displaced right scapular fracture. Surrounding intramuscular hematoma. No other acute intra thoracic process Thoracic aortic and coronary atherosclerosis Electronically Signed   By: Jerilynn Mages.  Shick M.D.   On: 10/22/2015 16:09   US Carotid Bilateral  10/22/2015  CLINICAL DATA:  Syncopal episode. History of stroke/TIA and hypertension. EXAM: BILATERAL CAROTID DUPLEX ULTRASOUND TECHNIQUE: Pearline Cables scale imaging, color Doppler and duplex ultrasound were performed of bilateral carotid and vertebral arteries in the neck. COMPARISON:  Head CT - earlier same day FINDINGS: Criteria: Quantification of carotid  stenosis is based on velocity parameters that correlate the residual internal carotid diameter with NASCET-based stenosis levels, using the diameter of the distal internal carotid lumen as the denominator for stenosis measurement. The following velocity measurements were obtained: RIGHT ICA:  64/11 cm/sec CCA:  [id number] cm/sec SYSTOLIC ICA/CCA RATIO:  0.9 DIASTOLIC  ICA/CCA RATIO:  0.8 ECA:  92 cm/sec LEFT ICA:  74/35 cm/sec CCA:  [id number] cm/sec SYSTOLIC ICA/CCA RATIO:  1.0 DIASTOLIC ICA/CCA RATIO:  2.3 ECA:  75 cm/sec RIGHT CAROTID ARTERY: There is a moderate amount of eccentric mixed echogenic plaque within the right carotid bulb (images 14 and 16), extending to involve the origin and proximal aspect the right internal carotid artery (image 24), not resulting in elevated peak systolic velocities within the interrogated course the right internal carotid artery to suggest a hemodynamically significant stenosis. RIGHT VERTEBRAL ARTERY:  Antegrade flow LEFT CAROTID ARTERY: There is a minimal amount of eccentric mixed echogenic plaque within the left carotid bulb (images 48 and 50), extending to involve the origin and proximal aspect the left internal carotid artery (image 58, not resulting in elevated peak systolic velocities within the interrogated course of the left internal carotid artery to suggest a hemodynamically significant stenosis. LEFT VERTEBRAL ARTERY:  Antegrade flow Note is made of a cardiac arrhythmia (representative image 60) IMPRESSION: 1. Minimal to moderate amount of bilateral atherosclerotic plaque, right greater than left, not resulting in a hemodynamically significant stenosis within either internal carotid artery. 2. Incidental note made of a cardiac arrhythmia. Further evaluation with ECG monitoring could be performed as clinically indicated. Electronically Signed   By: Sandi Mariscal M.D.   On: 10/22/2015 17:48    EKG:   Orders placed or performed during the hospital encounter of 05/15/15  . ED  EKG  . ED EKG  . EKG 12-Lead  . EKG 12-Lead    ASSESSMENT AND PLAN:   Dylan Faulkner is a 75 y.o. male presenting with Near Syncope . Admitted 10/22/2015 : Day #: none 1 near syncope: Workup negative no further intervention required 2. Right scapular fracture: Orthopedic consult pending, continue pain medication 3. Chronic Atrial fibrillation: Continue warfarin, rate controlled 4. Hyperlipidemia unspecified: Statin therapy 5. Essential hypertension lisinopril, metoprolol 6. Venous thrombi embolism prophylactic: Therapeutic warfarin    All the records are reviewed and case discussed with Care Management/Social Workerr. Management plans discussed with the patient, family and they are in agreement.  CODE STATUS: full TOTAL TIME TAKING CARE OF THIS PATIENT: 28 minutes.   POSSIBLE D/C IN 1DAYS, DEPENDING ON CLINICAL CONDITION.   Kiyanna Biegler,  Karenann Cai.D on 10/23/2015 at 1:25 PM  Between 7am to 6pm - Pager - [phone]  After 6pm: House Pager: - [phone]  [facility]  Office  [phone]  CC: Primary care physician; Marygrace Drought, MD

## 2015-10-23 NOTE — Care Management Obs Status (Signed)
[facility] NOTIFICATION   Patient Details  Name: Dylan Faulkner MRN: [id number] Date of Birth: 11-02-1940   Medicare Observation Status Notification Given:  Yes    Jolly Mango, RN 10/23/2015, 10:17 AM

## 2015-10-23 NOTE — Progress Notes (Signed)
ANTICOAGULATION CONSULT NOTE - Initial Consult  Pharmacy Consult for Warfarin  Indication: atrial fibrillation  No Known Allergies  Patient Measurements: Height: 5\' 10"  (177.8 cm) Weight: 154 lb 6.4 oz (70.035 kg) IBW/kg (Calculated) : 73  Heparin Dosing Weight:   Vital Signs: Temp: 98.5 F (36.9 C) (07/20 1204) Temp Source: Oral (07/20 1204) BP: 100/60 mmHg (07/20 1204) Pulse Rate: 52 (07/20 1204)  Labs:  Recent Labs  10/22/15 1255 10/23/15 0452  HGB 12.2* 11.5*  HCT 34.9* 33.4*  PLT 222 185  LABPROT 31.3* 34.1*  INR 3.09 3.46  CREATININE 2.10* 1.97*  CKTOTAL 243  --   TROPONINI 0.03*  --     Estimated Creatinine Clearance: 33.1 mL/min (by C-G formula based on Cr of 1.97).   Medical History: Past Medical History  Diagnosis Date  . Hypertension   . Heart failure ([place])   . Vertigo   . 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 Hyperlipidemia   . Enlarged prostate   . A-fib ([place])     Medications:  Prescriptions prior to admission  Medication Sig Dispense Refill Last Dose  . acetaminophen (TYLENOL) 500 MG tablet Take 500 mg by mouth once as needed for headache.    Past Week at Unknown time  . albuterol (PROVENTIL HFA;VENTOLIN HFA) 108 (90 Base) MCG/ACT inhaler Inhale 2 puffs into the lungs every 4 (four) hours as needed for wheezing.   10/22/2015 at Unknown time  . aspirin EC 81 MG tablet Take 81 mg by mouth daily.   10/22/2015 at Unknown time  . cyanocobalamin 500 MCG tablet Take 500 mcg by mouth daily.   05/15/2015 at Unknown time  . diazepam (VALIUM) 2 MG tablet Take 2 mg by mouth at bedtime.   10/21/2015 at Unknown time  . finasteride (PROSCAR) 5 MG tablet Take 5 mg by mouth daily.   10/22/2015 at Unknown time  . Fluticasone-Salmeterol (ADVAIR) 250-50 MCG/DOSE AEPB Inhale 1 puff into the lungs 2 (two) times daily.   10/22/2015 at Unknown time  . folic acid (FOLVITE) 1 MG tablet Take 1 mg by mouth daily.   10/22/2015 at Unknown time  . furosemide (LASIX) 20 MG tablet  Take 20 mg by mouth 2 (two) times daily.   10/22/2015 at Unknown time  . gabapentin (NEURONTIN) 600 MG tablet Take 600 mg by mouth 3 (three) times daily.   10/22/2015 at Unknown time  . HYDROcodone-acetaminophen (NORCO) 10-325 MG tablet Take 1 tablet by mouth every 6 (six) hours as needed for moderate pain or severe pain.   10/22/2015 at Unknown time  . isosorbide mononitrate (IMDUR) 30 MG 24 hr tablet Take 1 tablet (30 mg total) by mouth daily. 30 tablet 0 10/22/2015 at Unknown time  . lisinopril (PRINIVIL,ZESTRIL) 10 MG tablet Take 10 mg by mouth daily.   10/22/2015 at Unknown time  . loratadine (CLARITIN) 10 MG tablet Take 10 mg by mouth daily.   10/22/2015 at Unknown time  . magnesium oxide (MAG-OX) 400 MG tablet Take 400 mg by mouth daily.   10/22/2015 at Unknown time  . meclizine (ANTIVERT) 25 MG tablet Take 25 mg by mouth 3 (three) times daily as needed for dizziness.   10/22/2015 at Unknown time  . montelukast (SINGULAIR) 10 MG tablet Take 10 mg by mouth daily.   10/22/2015 at Unknown time  . Multiple Vitamin (MULTIVITAMIN WITH MINERALS) TABS tablet Take 1 tablet by mouth daily.   10/22/2015 at Unknown time  . omeprazole (PRILOSEC) 20  MG capsule Take 20 mg by mouth daily.   10/22/2015 at Unknown time  . pyridOXINE (VITAMIN B-6) 50 MG tablet Take 50 mg by mouth daily.   10/22/2015 at Unknown time  . sertraline (ZOLOFT) 50 MG tablet Take 50 mg by mouth daily.   10/21/2015 at Unknown time  . simvastatin (ZOCOR) 20 MG tablet Take 20 mg by mouth daily.   10/22/2015 at Unknown time  . tamsulosin (FLOMAX) 0.4 MG CAPS capsule Take 0.4 mg by mouth daily.    10/22/2015 at Unknown time  . warfarin (COUMADIN) 5 MG tablet Take 5 mg by mouth daily.    10/22/2015 at Unknown time  . fluticasone (FLONASE) 50 MCG/ACT nasal spray Place 2 sprays into both nostrils daily as needed for allergies or rhinitis.   prn at prn  . nitroGLYCERIN (NITROSTAT) 0.4 MG SL tablet Place 0.4 mg under the tongue every 5 (five) minutes as needed  for chest pain.   prn at prn    Assessment: Patient on warfarin for afib. Admitted for fall/syncope  Per medication history, was taking 5mg  daily, recently increased from 5mg  Monday-Friday and 2.5mg  on Saturday and Sunday.  7/19:  INR = 3.09, 5mg  dose taken PTA 7/20:  INR =  3.46  Goal of Therapy:  INR 2-3   Plan:  Discussed resumption of warfarin with MD, fall not actually due to syncope, will restart warfarin when clinically indicated (INR trending down). INR supratherapeutic and trending up, will hold today's dose and recheck with AM labs.   Dylan Faulkner C 10/23/2015,2:35 PM

## 2015-10-24 LAB — PROTIME-INR
INR: 3.54
Prothrombin Time: 34.7 seconds — ABNORMAL HIGH (ref 11.4–15.0)

## 2015-10-24 MED ORDER — GABAPENTIN 600 MG PO TABS: 600.0000 mg | ORAL_TABLET | Freq: Two times a day (BID) | ORAL | Status: DC

## 2015-10-24 MED ORDER — HYDROCODONE-ACETAMINOPHEN 10-325 MG PO TABS: 1.0000 | ORAL_TABLET | Freq: Four times a day (QID) | ORAL | Status: DC | PRN

## 2015-10-24 NOTE — Progress Notes (Signed)
Discharge instructions explained to pt and pts spouse/ verbalized an understanding/ iv and tele removed/ rx given to pt/ transported off unit via wheelchair.  

## 2015-10-24 NOTE — Progress Notes (Signed)
Less complaints of pain this shift.  Immobilizer remains intact to right shoulder.  Slept at longer intervals.  Afib continues on telemetry.

## 2015-10-24 NOTE — Progress Notes (Signed)
Face to Face Home health: Routine, at discharge: first occurrence today, until specified. To provide the following care/treatments: PT/OT/RN/Social work, [place]  1. The encounter with the patient was in whole, or in part, of the following medical condition, wich is the primary reason for home health care: right scapular fracture, syncope  2. I certify that, based on my findings, the following services are medically necessary: Nursing, PT  3. Reasons of medically necessary services: skilled nursing - skilled assessment/observation 4. My clinical findings support the need for the above services: Pain interferes with mobility 5. Further, I certify that my clinical findings support that this patient is homebound due to: Pain interferes with mobility   I Hower,  Aaron Mose certify that this patient is under my care and that I, or a nurse practitioner or physician's assistant working with me, had a face-to-face encounter that meets the physician face-to-face encounter requirements with this patient on 10/24/2015. The encounter with the patient was in whole, or in part for the following medical condition(s) which is the primary reason for home health care (List medical condition): right scapular fracture, syncope

## 2015-10-24 NOTE — Care Management (Addendum)
TC received from wife. She states she is unable to care for patient. She weighs 66 lbs and the patient is too much for her to care for.  Admitted with syncope, fall, head injury, right scapula fracture and afib.  PCP is Dr. Guadelupe Sabin. Wife is agreeable to home health services with no agency preference. [facility]erral to [facility] for SN, PT, OT , HHA and SW and requested a visit on Saturday.  Waiting to see if [facility] can provide services.

## 2015-10-24 NOTE — Progress Notes (Deleted)
RN informed CSW that patient does not have ride home until 8:30 PM. Stated they've called everyone. Reported that they do not have the money to get home. CSW provided a taxi voucher.  Ernest Pine, MSW, [place], [place] Clinical Social Worker [phone]

## 2015-10-24 NOTE — Consult Note (Signed)
ORTHOPAEDIC CONSULTATION  REQUESTING PHYSICIAN: Lytle Butte, MD  Chief Complaint:   Right shoulder pain.  History of Present Illness: Dylan Faulkner is a 75 y.o. male who lives independently with his wife. Apparently, he was ascending some steps at his house when he apparently sustained a syncopal episode and fell backwards down an approximate 4 steps and landed on his right shoulder. He also apparently struck the back of his head. He was brought to the emergency room where x-rays of his right shoulder demonstrated a minimally displaced scapular fracture. Subsequent CT scanning of his head and chest were performed. There was no evidence for any intracranial bleeds or other intracranial pathology. The chest CT also included the right scapular region and showed minimally displaced fractures of the scapular spine extending into the base of the acromion and into the base of the coracoid process. The patient was admitted for further workup of syncopal episode after being placed into a shoulder sling by the ER staff. Orthopedic consultation has been requested. The patient denies any numbness or paresthesias down his arm to his hand. He denies any prior injury or problems with his right shoulder.  Past Medical History  Diagnosis Date  . Hypertension   . Heart failure ([place])   . Vertigo   . 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 Hyperlipidemia   . Enlarged prostate   . A-fib [facility])    Past Surgical History  Procedure Laterality Date  . Hernia repair    . Neck fusion    . Biceps tendon repair     Social History   Social History  . Marital Status: Married    Spouse Name: N/A  . Number of Children: N/A  . Years of Education: N/A   Social History Main Topics  . Smoking status: Never Smoker   . Smokeless tobacco: None  . Alcohol Use: No  . Drug Use: No  . Sexual Activity: Yes   Other Topics Concern  . None   Social History  Narrative   Patient is illiterate   Family History  Problem Relation Age of Onset  . Hypertension     No Known Allergies Prior to Admission medications   Medication Sig Start Date End Date Taking? Authorizing Provider  acetaminophen (TYLENOL) 500 MG tablet Take 500 mg by mouth once as needed for headache.    Yes Historical Provider, MD  albuterol (PROVENTIL HFA;VENTOLIN HFA) 108 (90 Base) MCG/ACT inhaler Inhale 2 puffs into the lungs every 4 (four) hours as needed for wheezing.   Yes Historical Provider, MD  aspirin EC 81 MG tablet Take 81 mg by mouth daily.   Yes Historical Provider, MD  cyanocobalamin 500 MCG tablet Take 500 mcg by mouth daily.   Yes Historical Provider, MD  diazepam (VALIUM) 2 MG tablet Take 2 mg by mouth at bedtime.   Yes Historical Provider, MD  finasteride (PROSCAR) 5 MG tablet Take 5 mg by mouth daily.   Yes Historical Provider, MD  Fluticasone-Salmeterol (ADVAIR) 250-50 MCG/DOSE AEPB Inhale 1 puff into the lungs 2 (two) times daily.   Yes Historical Provider, MD  folic acid (FOLVITE) 1 MG tablet Take 1 mg by mouth daily.   Yes Historical Provider, MD  furosemide (LASIX) 20 MG tablet Take 20 mg by mouth 2 (two) times daily.   Yes Historical Provider, MD  gabapentin (NEURONTIN) 600 MG tablet Take 600 mg by mouth 3 (three) times daily.   Yes Historical Provider, MD  HYDROcodone-acetaminophen (NORCO) 10-325 MG tablet Take 1 tablet by mouth every 6 (six) hours as needed for moderate pain or severe pain.   Yes Historical Provider, MD  isosorbide mononitrate (IMDUR) 30 MG 24 hr tablet Take 1 tablet (30 mg total) by mouth daily. 05/16/15  Yes Sital Mody, MD  lisinopril (PRINIVIL,ZESTRIL) 10 MG tablet Take 10 mg by mouth daily.   Yes Historical Provider, MD  loratadine (CLARITIN) 10 MG tablet Take 10 mg by mouth daily.   Yes Historical Provider, MD  magnesium oxide (MAG-OX) 400 MG tablet Take 400 mg by mouth daily.   Yes Historical Provider, MD  meclizine (ANTIVERT) 25 MG  tablet Take 25 mg by mouth 3 (three) times daily as needed for dizziness.   Yes Historical Provider, MD  montelukast (SINGULAIR) 10 MG tablet Take 10 mg by mouth daily.   Yes Historical Provider, MD  Multiple Vitamin (MULTIVITAMIN WITH MINERALS) TABS tablet Take 1 tablet by mouth daily.   Yes Historical Provider, MD  omeprazole (PRILOSEC) 20 MG capsule Take 20 mg by mouth daily.   Yes Historical Provider, MD  pyridOXINE (VITAMIN B-6) 50 MG tablet Take 50 mg by mouth daily.   Yes Historical Provider, MD  sertraline (ZOLOFT) 50 MG tablet Take 50 mg by mouth daily.   Yes Historical Provider, MD  simvastatin (ZOCOR) 20 MG tablet Take 20 mg by mouth daily.   Yes Historical Provider, MD  tamsulosin (FLOMAX) 0.4 MG CAPS capsule Take 0.4 mg by mouth daily.    Yes Historical Provider, MD  warfarin (COUMADIN) 5 MG tablet Take 5 mg by mouth daily.    Yes Historical Provider, MD  fluticasone (FLONASE) 50 MCG/ACT nasal spray Place 2 sprays into both nostrils daily as needed for allergies or rhinitis.    Historical Provider, MD  nitroGLYCERIN (NITROSTAT) 0.4 MG SL tablet Place 0.4 mg under the tongue every 5 (five) minutes as needed for chest pain.    Historical Provider, MD   Dg Shoulder Right  10/22/2015  CLINICAL DATA:  Patient fell this morning, possibly after syncopal episode. Posterior shoulder pain with swelling and bruising. EXAM: RIGHT SHOULDER - 2+ VIEW COMPARISON:  None. FINDINGS: Bones are diffusely demineralized. Three views study shows a scapular fracture with coracoid process isolated is free fragment. IMPRESSION: Comminuted scapular fracture. Electronically Signed   By: Misty Stanley M.D.   On: 10/22/2015 13:47   Ct Head Wo Contrast  10/22/2015  CLINICAL DATA:  Syncopal episode, now with headache injury. EXAM: CT HEAD WITHOUT CONTRAST TECHNIQUE: Contiguous axial images were obtained from the base of the skull through the vertex without intravenous contrast. COMPARISON:  04/21/2014 FINDINGS: Brain:  There is soft tissue swelling about the right side of the posterior parietal / occipital calvarium (representative image 16, series 2; sagittal image 14, series 6). No associated radiopaque foreign body or displaced calvarial fracture. Similar findings of mild atrophy with sulcal prominence centralized volume loss. Rather extensive periventricular hypodensities compatible microvascular ischemic disease, similar to the 04/2014 examination. Given background parenchymal abnormalities, there is no CT evidence superimposed acute large territory infarct. No intraparenchymal or extra-axial mass or hemorrhage. Normal size and configuration of the ventricles and basilar cisterns. No midline shift. Vascular: Intracranial atherosclerosis. Skull: No displaced calvarial fracture Sinuses/Orbits: There is under pneumatization of the left mastoid air cells, unchanged. Remaining paranasal sinuses and right mastoid air cells are normally aerated. No air-fluid levels. Other: None. IMPRESSION: 1. Soft tissue swelling about the right posterior parietal/occipital calvarium without associated radiopaque foreign  body, displaced calvarial fracture or acute intracranial process. 2. Similar findings of atrophy and microvascular ischemic disease without acute intracranial process. Electronically Signed   By: Sandi Mariscal M.D.   On: 10/22/2015 13:46   Ct Chest Wo Contrast  10/22/2015  CLINICAL DATA:  Fall backwards, right posterior scalp hematoma. Also right shoulder and neck pain. Right scapula fracture. EXAM: CT CHEST WITHOUT CONTRAST TECHNIQUE: Multidetector CT imaging of the chest was performed following the standard protocol without IV contrast. COMPARISON:  10/22/2015 FINDINGS: Mediastinum/Lymph Nodes: Atherosclerosis of the major branch vessels and the aorta without significant aneurysm. Coronary atherosclerosis noted. Normal heart size. No pericardial effusion. No adenopathy within the limits of noncontrast imaging. Intravenous air  within the right jugular vein, and also the right atrial appendage from peripheral IV access. Lungs/Pleura: Minor dependent lower lobe atelectasis versus scarring. No focal airspace process, collapse or consolidation. No interstitial process, edema or pneumothorax. No pleural effusion or pleural abnormality. Trachea central airways remain patent. Upper abdomen: Abdominal atherosclerosis noted. Incidental 2 cm left upper pole renal cyst. No acute upper abdominal finding. Musculoskeletal: Acute displaced right scapular fracture along the scapular spine extending into the coracoid process and also the acromion. Fracture does not involve the glenoid fossa. There is surrounding hemorrhage in the right shoulder musculature. Clavicle intact. No adjacent displaced rib fracture. Minor thoracic spine degenerative change. No thoracic compression fracture. Sternum intact. IMPRESSION: Acute displaced right scapular fracture. Surrounding intramuscular hematoma. No other acute intra thoracic process Thoracic aortic and coronary atherosclerosis Electronically Signed   By: Jerilynn Mages.  Shick M.D.   On: 10/22/2015 16:09   US Carotid Bilateral  10/22/2015  CLINICAL DATA:  Syncopal episode. History of stroke/TIA and hypertension. EXAM: BILATERAL CAROTID DUPLEX ULTRASOUND TECHNIQUE: Pearline Cables scale imaging, color Doppler and duplex ultrasound were performed of bilateral carotid and vertebral arteries in the neck. COMPARISON:  Head CT - earlier same day FINDINGS: Criteria: Quantification of carotid stenosis is based on velocity parameters that correlate the residual internal carotid diameter with NASCET-based stenosis levels, using the diameter of the distal internal carotid lumen as the denominator for stenosis measurement. The following velocity measurements were obtained: RIGHT ICA:  64/11 cm/sec CCA:  [id number] cm/sec SYSTOLIC ICA/CCA RATIO:  0.9 DIASTOLIC ICA/CCA RATIO:  0.8 ECA:  92 cm/sec LEFT ICA:  74/35 cm/sec CCA:  [id number] cm/sec SYSTOLIC  ICA/CCA RATIO:  1.0 DIASTOLIC ICA/CCA RATIO:  2.3 ECA:  75 cm/sec RIGHT CAROTID ARTERY: There is a moderate amount of eccentric mixed echogenic plaque within the right carotid bulb (images 14 and 16), extending to involve the origin and proximal aspect the right internal carotid artery (image 24), not resulting in elevated peak systolic velocities within the interrogated course the right internal carotid artery to suggest a hemodynamically significant stenosis. RIGHT VERTEBRAL ARTERY:  Antegrade flow LEFT CAROTID ARTERY: There is a minimal amount of eccentric mixed echogenic plaque within the left carotid bulb (images 48 and 50), extending to involve the origin and proximal aspect the left internal carotid artery (image 58, not resulting in elevated peak systolic velocities within the interrogated course of the left internal carotid artery to suggest a hemodynamically significant stenosis. LEFT VERTEBRAL ARTERY:  Antegrade flow Note is made of a cardiac arrhythmia (representative image 60) IMPRESSION: 1. Minimal to moderate amount of bilateral atherosclerotic plaque, right greater than left, not resulting in a hemodynamically significant stenosis within either internal carotid artery. 2. Incidental note made of a cardiac arrhythmia. Further evaluation with ECG monitoring could be performed as clinically indicated.  Electronically Signed   By: Sandi Mariscal M.D.   On: 10/22/2015 17:48    Positive ROS: All other systems have been reviewed and were otherwise negative with the exception of those mentioned in the HPI and as above.  Physical Exam: General:  Alert, no acute distress Psychiatric:  Patient is competent for consent with normal mood and affect   Cardiovascular:  No pedal edema Respiratory:  No wheezing, non-labored breathing GI:  Abdomen is soft and non-tender Skin:  No lesions in the area of chief complaint Neurologic:  Sensation intact distally Lymphatic:  No axillary or cervical  lymphadenopathy  Orthopedic Exam:  Orthopedic examination is limited to the right shoulder and upper extremity. There is moderate swelling around the shoulder, but otherwise skin inspection is unremarkable in he has moderate tenderness to palpation over the anterior posterior aspects of the shoulder, as well as pain with any attempted active or passive motion of the shoulder. He is able to actively flex and extend the elbow, wrist, and all digits of his hand. He has intact sensation to light touch over the axillary nerve and muscular cutaneous nerve distributions. He is neurovascular intact to his right upper extremity and hand.  X-rays:  Plain radiographs and a CT scan of the right shoulder are available for review. The findings are as described above. Given that the glenohumeral joint itself is undisturbed and that the coracoid and acromial fractures are essentially nondisplaced, I feel that this fracture can be managed nonsurgically.  Assessment: Minimally displaced comminuted fracture right scapula.  Plan: The treatment options were discussed with the patient. I believe that this fracture can be managed nonsurgically. I will order a more accommodating shoulder immobilizer that hopefully will keep the patient more comfortable while maintaining better immobilization. He may loosen the immobilizer for sponge bathing and hygienic purposes, but otherwise is to keep the shoulder immobilizer on at all times. He is to receive appropriate pain medication for discomfort.  Thank you for asking me to participate in the care of this most pleasant man. I will look forward to seeing him back in my office in 2-3 weeks for repeat x-rays to be sure that the fractures are remaining appropriately aligned.   Pascal Lux, MD  Beeper #:  [phone]  10/24/2015 8:03 AM

## 2015-10-24 NOTE — Care Management Note (Signed)
Case Management Note  Patient Details  Name: Dylan Faulkner MRN: [id number] Date of Birth: 11-22-1940  Subjective/Objective: Spoke with wife and updated her on POC. She was tearful and thankful.                    Action/Plan:   Expected Discharge Date:                  Expected Discharge Plan:  [place]  In-House Referral:     Discharge planning Services  CM Consult  Post Acute Care Choice:  Home Health Choice offered to:  Spouse  DME Arranged:    DME Agency:     HH Arranged:  RN, PT, OT, Nurse's Aide, Social Work [employer] Agency:  [place]  Status of Service:  Completed, signed off  If discussed at [employer], dates discussed:    Additional Comments:  Jolly Mango, RN 10/24/2015, 2:09 PM

## 2015-10-24 NOTE — Discharge Summary (Signed)
[facility] [place] NAME: Mardell Hillers    MR#:  [id number]  DATE OF BIRTH:  12/02/1940  DATE OF ADMISSION:  10/22/2015 ADMITTING PHYSICIAN: Dustin Flock, MD  DATE OF DISCHARGE: 10/24/2015  PRIMARY CARE PHYSICIAN: HEFFINGTON, MARK, MD    ADMISSION DIAGNOSIS:  Syncope and collapse [R55] Dehydration [E86.0] Syncope [R55] Head injury, initial encounter [S09.90XA] Scapula fracture, right, open, initial encounter [S42.101B]  DISCHARGE DIAGNOSIS:  Active Problems:   Syncope   SECONDARY DIAGNOSIS:   Past Medical History  Diagnosis Date  . Hypertension   . Heart failure ([place])   . Vertigo   . 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 Hyperlipidemia   . Enlarged prostate   . A-fib [facility])     HOSPITAL COURSE:  Leto Bol  is a 75 y.o. male admitted 10/22/2015 with chief complaint Near Syncope . Please see H&P performed by Dustin Flock, MD for further information. Patient presented with above complaints. Suffered fractured right scapula in the fall. Work up for near syncope unrevealing. Evaluated by orthopedics - recommend non surgical approach.  DISCHARGE CONDITIONS:   stable  CONSULTS OBTAINED:  Treatment Team:  Corky Mull, MD  DRUG ALLERGIES:  No Known Allergies  DISCHARGE MEDICATIONS:   Current Discharge Medication List    CONTINUE these medications which have CHANGED   Details  gabapentin (NEURONTIN) 600 MG tablet Take 1 tablet (600 mg total) by mouth 2 (two) times daily. Qty: 60 tablet, Refills: 0    !! HYDROcodone-acetaminophen (NORCO) 10-325 MG tablet Take 1 tablet by mouth every 6 (six) hours as needed for moderate pain or severe pain. Qty: 30 tablet, Refills: 0     !! - Potential duplicate medications found. Please discuss with provider.    CONTINUE these medications which have NOT CHANGED   Details  acetaminophen (TYLENOL) 500 MG tablet Take 500 mg by mouth once as needed for headache.     albuterol  (PROVENTIL HFA;VENTOLIN HFA) 108 (90 Base) MCG/ACT inhaler Inhale 2 puffs into the lungs every 4 (four) hours as needed for wheezing.    aspirin EC 81 MG tablet Take 81 mg by mouth daily.    cyanocobalamin 500 MCG tablet Take 500 mcg by mouth daily.    diazepam (VALIUM) 2 MG tablet Take 2 mg by mouth at bedtime.    finasteride (PROSCAR) 5 MG tablet Take 5 mg by mouth daily.    Fluticasone-Salmeterol (ADVAIR) 250-50 MCG/DOSE AEPB Inhale 1 puff into the lungs 2 (two) times daily.    folic acid (FOLVITE) 1 MG tablet Take 1 mg by mouth daily.    furosemide (LASIX) 20 MG tablet Take 20 mg by mouth 2 (two) times daily.    !! HYDROcodone-acetaminophen (NORCO) 10-325 MG tablet Take 1 tablet by mouth every 6 (six) hours as needed for moderate pain or severe pain.    isosorbide mononitrate (IMDUR) 30 MG 24 hr tablet Take 1 tablet (30 mg total) by mouth daily. Qty: 30 tablet, Refills: 0    lisinopril (PRINIVIL,ZESTRIL) 10 MG tablet Take 10 mg by mouth daily.    loratadine (CLARITIN) 10 MG tablet Take 10 mg by mouth daily.    magnesium oxide (MAG-OX) 400 MG tablet Take 400 mg by mouth daily.    meclizine (ANTIVERT) 25 MG tablet Take 25 mg by mouth 3 (three) times daily as needed for dizziness.    montelukast (SINGULAIR) 10 MG tablet Take 10 mg by mouth daily.  Multiple Vitamin (MULTIVITAMIN WITH MINERALS) TABS tablet Take 1 tablet by mouth daily.    omeprazole (PRILOSEC) 20 MG capsule Take 20 mg by mouth daily.    pyridOXINE (VITAMIN B-6) 50 MG tablet Take 50 mg by mouth daily.    sertraline (ZOLOFT) 50 MG tablet Take 50 mg by mouth daily.    simvastatin (ZOCOR) 20 MG tablet Take 20 mg by mouth daily.    tamsulosin (FLOMAX) 0.4 MG CAPS capsule Take 0.4 mg by mouth daily.     warfarin (COUMADIN) 5 MG tablet Take 5 mg by mouth daily.     fluticasone (FLONASE) 50 MCG/ACT nasal spray Place 2 sprays into both nostrils daily as needed for allergies or rhinitis.    nitroGLYCERIN  (NITROSTAT) 0.4 MG SL tablet Place 0.4 mg under the tongue every 5 (five) minutes as needed for chest pain.     !! - Potential duplicate medications found. Please discuss with provider.    STOP taking these medications     aspirin 81 MG tablet      metoprolol tartrate (LOPRESSOR) 25 MG tablet          DISCHARGE INSTRUCTIONS:  Follow with ortho: 2-3 weeks maintaining better immobilization. He may loosen the immobilizer for sponge bathing and hygienic purposes, but otherwise is to keep the shoulder immobilizer on at all times.  Hold warfarin for 2 days - INR 3.5 DIET:  Cardiac diet  DISCHARGE CONDITION:  Stable  ACTIVITY:  Activity as tolerated  OXYGEN:  Home Oxygen: No.   Oxygen Delivery: room air  DISCHARGE LOCATION:  home   If you experience worsening of your admission symptoms, develop shortness of breath, life threatening emergency, suicidal or homicidal thoughts you must seek medical attention immediately by calling 911 or calling your MD immediately  if symptoms less severe.  You Must read complete instructions/literature along with all the possible adverse reactions/side effects for all the Medicines you take and that have been prescribed to you. Take any new Medicines after you have completely understood and accpet all the possible adverse reactions/side effects.   Please note  You were cared for by a hospitalist during your hospital stay. If you have any questions about your discharge medications or the care you received while you were in the hospital after you are discharged, you can call the unit and asked to speak with the hospitalist on call if the hospitalist that took care of you is not available. Once you are discharged, your primary care physician will handle any further medical issues. Please note that NO REFILLS for any discharge medications will be authorized once you are discharged, as it is imperative that you return to your primary care physician (or  establish a relationship with a primary care physician if you do not have one) for your aftercare needs so that they can reassess your need for medications and monitor your lab values.    On the day of Discharge:   VITAL SIGNS:  Blood pressure 106/71, pulse 79, temperature 98.4 F (36.9 C), temperature source Oral, resp. rate 18, height 5\' 10"  (1.778 m), weight 154 lb 6.4 oz (70.035 kg), SpO2 97 %.  I/O:   Intake/Output Summary (Last 24 hours) at 10/24/15 0939 Last data filed at 10/24/15 0839  Gross per 24 hour  Intake   1100 ml  Output   1850 ml  Net   -750 ml    PHYSICAL EXAMINATION:  GENERAL:  75 y.o.-year-old patient lying in the bed with no acute  distress.  EYES: Pupils equal, round, reactive to light and accommodation. No scleral icterus. Extraocular muscles intact.  HEENT: Head atraumatic, normocephalic. Oropharynx and nasopharynx clear.  NECK:  Supple, no jugular venous distention. No thyroid enlargement, no tenderness.  LUNGS: Normal breath sounds bilaterally, no wheezing, rales,rhonchi or crepitation. No use of accessory muscles of respiration.  CARDIOVASCULAR: S1, S2 irregular. No murmurs, rubs, or gallops.  ABDOMEN: Soft, non-tender, non-distended. Bowel sounds present. No organomegaly or mass.  EXTREMITIES: No pedal edema, cyanosis, or clubbing. Right arm immobilized  NEUROLOGIC: Cranial nerves II through XII are intact. Muscle strength 5/5 in all extremities. Sensation intact. Gait not checked.  PSYCHIATRIC: The patient is alert and oriented x 3.  SKIN: No obvious rash, lesion, or ulcer.   DATA REVIEW:   CBC  Recent Labs Lab 10/23/15 0452  WBC 9.2  HGB 11.5*  HCT 33.4*  PLT 185    Chemistries   Recent Labs Lab 10/22/15 1255 10/23/15 0452  NA 141 136  K 3.8 4.2  CL 106 105  CO2 26 25  GLUCOSE 90 140*  BUN 24* 31*  CREATININE 2.10* 1.97*  CALCIUM 9.0 8.3*  AST 25  --   ALT 20  --   ALKPHOS 59  --   BILITOT 1.0  --     Cardiac  Enzymes  Recent Labs Lab 10/22/15 1255  TROPONINI 0.03*    Microbiology Results  No results found for this or any previous visit.  RADIOLOGY:  Dg Shoulder Right  10/22/2015  CLINICAL DATA:  Patient fell this morning, possibly after syncopal episode. Posterior shoulder pain with swelling and bruising. EXAM: RIGHT SHOULDER - 2+ VIEW COMPARISON:  None. FINDINGS: Bones are diffusely demineralized. Three views study shows a scapular fracture with coracoid process isolated is free fragment. IMPRESSION: Comminuted scapular fracture. Electronically Signed   By: Misty Stanley M.D.   On: 10/22/2015 13:47   Ct Head Wo Contrast  10/22/2015  CLINICAL DATA:  Syncopal episode, now with headache injury. EXAM: CT HEAD WITHOUT CONTRAST TECHNIQUE: Contiguous axial images were obtained from the base of the skull through the vertex without intravenous contrast. COMPARISON:  04/21/2014 FINDINGS: Brain: There is soft tissue swelling about the right side of the posterior parietal / occipital calvarium (representative image 16, series 2; sagittal image 14, series 6). No associated radiopaque foreign body or displaced calvarial fracture. Similar findings of mild atrophy with sulcal prominence centralized volume loss. Rather extensive periventricular hypodensities compatible microvascular ischemic disease, similar to the 04/2014 examination. Given background parenchymal abnormalities, there is no CT evidence superimposed acute large territory infarct. No intraparenchymal or extra-axial mass or hemorrhage. Normal size and configuration of the ventricles and basilar cisterns. No midline shift. Vascular: Intracranial atherosclerosis. Skull: No displaced calvarial fracture Sinuses/Orbits: There is under pneumatization of the left mastoid air cells, unchanged. Remaining paranasal sinuses and right mastoid air cells are normally aerated. No air-fluid levels. Other: None. IMPRESSION: 1. Soft tissue swelling about the right posterior  parietal/occipital calvarium without associated radiopaque foreign body, displaced calvarial fracture or acute intracranial process. 2. Similar findings of atrophy and microvascular ischemic disease without acute intracranial process. Electronically Signed   By: Sandi Mariscal M.D.   On: 10/22/2015 13:46   Ct Chest Wo Contrast  10/22/2015  CLINICAL DATA:  Fall backwards, right posterior scalp hematoma. Also right shoulder and neck pain. Right scapula fracture. EXAM: CT CHEST WITHOUT CONTRAST TECHNIQUE: Multidetector CT imaging of the chest was performed following the standard protocol without IV contrast. COMPARISON:  10/22/2015 FINDINGS: Mediastinum/Lymph Nodes: Atherosclerosis of the major branch vessels and the aorta without significant aneurysm. Coronary atherosclerosis noted. Normal heart size. No pericardial effusion. No adenopathy within the limits of noncontrast imaging. Intravenous air within the right jugular vein, and also the right atrial appendage from peripheral IV access. Lungs/Pleura: Minor dependent lower lobe atelectasis versus scarring. No focal airspace process, collapse or consolidation. No interstitial process, edema or pneumothorax. No pleural effusion or pleural abnormality. Trachea central airways remain patent. Upper abdomen: Abdominal atherosclerosis noted. Incidental 2 cm left upper pole renal cyst. No acute upper abdominal finding. Musculoskeletal: Acute displaced right scapular fracture along the scapular spine extending into the coracoid process and also the acromion. Fracture does not involve the glenoid fossa. There is surrounding hemorrhage in the right shoulder musculature. Clavicle intact. No adjacent displaced rib fracture. Minor thoracic spine degenerative change. No thoracic compression fracture. Sternum intact. IMPRESSION: Acute displaced right scapular fracture. Surrounding intramuscular hematoma. No other acute intra thoracic process Thoracic aortic and coronary  atherosclerosis Electronically Signed   By: Jerilynn Mages.  Shick M.D.   On: 10/22/2015 16:09   US Carotid Bilateral  10/22/2015  CLINICAL DATA:  Syncopal episode. History of stroke/TIA and hypertension. EXAM: BILATERAL CAROTID DUPLEX ULTRASOUND TECHNIQUE: Pearline Cables scale imaging, color Doppler and duplex ultrasound were performed of bilateral carotid and vertebral arteries in the neck. COMPARISON:  Head CT - earlier same day FINDINGS: Criteria: Quantification of carotid stenosis is based on velocity parameters that correlate the residual internal carotid diameter with NASCET-based stenosis levels, using the diameter of the distal internal carotid lumen as the denominator for stenosis measurement. The following velocity measurements were obtained: RIGHT ICA:  64/11 cm/sec CCA:  [id number] cm/sec SYSTOLIC ICA/CCA RATIO:  0.9 DIASTOLIC ICA/CCA RATIO:  0.8 ECA:  92 cm/sec LEFT ICA:  74/35 cm/sec CCA:  [id number] cm/sec SYSTOLIC ICA/CCA RATIO:  1.0 DIASTOLIC ICA/CCA RATIO:  2.3 ECA:  75 cm/sec RIGHT CAROTID ARTERY: There is a moderate amount of eccentric mixed echogenic plaque within the right carotid bulb (images 14 and 16), extending to involve the origin and proximal aspect the right internal carotid artery (image 24), not resulting in elevated peak systolic velocities within the interrogated course the right internal carotid artery to suggest a hemodynamically significant stenosis. RIGHT VERTEBRAL ARTERY:  Antegrade flow LEFT CAROTID ARTERY: There is a minimal amount of eccentric mixed echogenic plaque within the left carotid bulb (images 48 and 50), extending to involve the origin and proximal aspect the left internal carotid artery (image 58, not resulting in elevated peak systolic velocities within the interrogated course of the left internal carotid artery to suggest a hemodynamically significant stenosis. LEFT VERTEBRAL ARTERY:  Antegrade flow Note is made of a cardiac arrhythmia (representative image 60) IMPRESSION: 1. Minimal to  moderate amount of bilateral atherosclerotic plaque, right greater than left, not resulting in a hemodynamically significant stenosis within either internal carotid artery. 2. Incidental note made of a cardiac arrhythmia. Further evaluation with ECG monitoring could be performed as clinically indicated. Electronically Signed   By: Sandi Mariscal M.D.   On: 10/22/2015 17:48     Management plans discussed with the patient, family and they are in agreement.  CODE STATUS:     Code Status Orders        Start     Ordered   10/22/15 1630  Full code   Continuous     10/22/15 1630    Code Status History    Date Active Date Inactive Code Status Order  ID Comments User Context   05/16/2015  3:42 AM 05/16/2015  3:49 PM Full Code [id number]  Harrie Foreman, MD Inpatient      TOTAL TIME TAKING CARE OF THIS PATIENT: 28 minutes.    Hower,  Karenann Cai.D on 10/24/2015 at 9:39 AM  Between 7am to 6pm - Pager - [phone]  After 6pm go to [email or web address] - Proofreader  [facility]  Office  [phone]  CC: Primary care physician; Marygrace Drought, MD

## 2015-10-26 ENCOUNTER — Emergency Department
Admission: EM | Admit: 2015-10-26 | Discharge: 2015-10-26 | Disposition: A | Discharge status: Home / Self Care | Payer: Medicare HMO | Attending: Emergency Medicine | Admitting: Emergency Medicine

## 2015-10-26 DIAGNOSIS — Z79899 Other long term (current) drug therapy: Secondary | ICD-10-CM | POA: Diagnosis not present

## 2015-10-26 DIAGNOSIS — S42101D Fracture of unspecified part of scapula, right shoulder, subsequent encounter for fracture with routine healing: Secondary | ICD-10-CM

## 2015-10-26 DIAGNOSIS — I472 Ventricular tachycardia: Secondary | ICD-10-CM | POA: Diagnosis not present

## 2015-10-26 DIAGNOSIS — I10 Essential (primary) hypertension: Secondary | ICD-10-CM | POA: Insufficient documentation

## 2015-10-26 DIAGNOSIS — Z7901 Long term (current) use of anticoagulants: Secondary | ICD-10-CM | POA: Diagnosis not present

## 2015-10-26 DIAGNOSIS — Z7982 Long term (current) use of aspirin: Secondary | ICD-10-CM | POA: Diagnosis not present

## 2015-10-26 DIAGNOSIS — X58XXXD Exposure to other specified factors, subsequent encounter: Secondary | ICD-10-CM | POA: Insufficient documentation

## 2015-10-26 DIAGNOSIS — Z87891 Personal history of nicotine dependence: Secondary | ICD-10-CM | POA: Insufficient documentation

## 2015-10-26 DIAGNOSIS — R000 Tachycardia, unspecified: Secondary | ICD-10-CM

## 2015-10-26 DIAGNOSIS — M25511 Pain in right shoulder: Secondary | ICD-10-CM | POA: Diagnosis present

## 2015-10-26 LAB — CBC WITH DIFFERENTIAL/PLATELET
BASOS PCT: 1 %
Basophils Absolute: 0.1 10*3/uL (ref 0–0.1)
EOS ABS: 0.1 10*3/uL (ref 0–0.7)
EOS PCT: 1 %
HCT: 29.4 % — ABNORMAL LOW (ref 40.0–52.0)
Hemoglobin: 10.3 g/dL — ABNORMAL LOW (ref 13.0–18.0)
Lymphocytes Relative: 18 %
Lymphs Abs: 1.6 10*3/uL (ref 1.0–3.6)
MCH: 34.3 pg — ABNORMAL HIGH (ref 26.0–34.0)
MCHC: 35 g/dL (ref 32.0–36.0)
MCV: 98 fL (ref 80.0–100.0)
MONO ABS: 1.6 10*3/uL — AB (ref 0.2–1.0)
MONOS PCT: 18 %
NEUTROS PCT: 62 %
Neutro Abs: 5.5 10*3/uL (ref 1.4–6.5)
PLATELETS: 195 10*3/uL (ref 150–440)
RBC: 3 MIL/uL — ABNORMAL LOW (ref 4.40–5.90)
RDW: 13.6 % (ref 11.5–14.5)
WBC: 8.9 10*3/uL (ref 3.8–10.6)

## 2015-10-26 LAB — BASIC METABOLIC PANEL
Anion gap: 6 (ref 5–15)
BUN: 42 mg/dL — ABNORMAL HIGH (ref 6–20)
CALCIUM: 8.4 mg/dL — AB (ref 8.9–10.3)
CO2: 23 mmol/L (ref 22–32)
CREATININE: 1.85 mg/dL — AB (ref 0.61–1.24)
Chloride: 101 mmol/L (ref 101–111)
GFR calc non Af Amer: 34 mL/min — ABNORMAL LOW (ref >60)
GFR, EST AFRICAN AMERICAN: 40 mL/min — AB (ref >60)
Glucose, Bld: 101 mg/dL — ABNORMAL HIGH (ref 65–99)
Potassium: 5.1 mmol/L (ref 3.5–5.1)
SODIUM: 130 mmol/L — AB (ref 135–145)

## 2015-10-26 LAB — PROTIME-INR
INR: 1.55
PROTHROMBIN TIME: 18.6 s — AB (ref 11.4–15.0)

## 2015-10-26 MED ORDER — OXYCODONE-ACETAMINOPHEN 5-325 MG PO TABS
2.0000 | ORAL_TABLET | Freq: Once | ORAL | Status: AC
  Administered 2015-10-26: 2 via ORAL
  Filled 2015-10-26: qty 2

## 2015-10-26 NOTE — ED Notes (Signed)
Reviewed d/c instructions, follow-up care with pt. Pt verbalized understanding 

## 2015-10-26 NOTE — ED Triage Notes (Signed)
Pt BIB EMS for previously  fractured shoulder, family called EMS B/C " pt is out of pain medication" per pt family has been taking his medication and he has not received any pain medication since being discharged

## 2015-10-26 NOTE — ED Notes (Signed)
BPD at bedside 

## 2015-10-26 NOTE — ED Provider Notes (Signed)
Dylan Faulkner   ____________________________________________  Time seen: Seen upon arrival to the emergency department  I have reviewed the triage vital signs and the nursing notes.   HISTORY  Chief Complaint Shoulder Pain   HPI Dylan Faulkner is a 75 y.o. male with a recent scapular fracture and elevated INR who is presenting to the emergency department today for pain control for his right-sided scapular fracture. He says that his family is stealing his pain meds no matter if he hides them or not. He is also taken off the shoulder immobilizer because he says his right arm feels better without it on.   Past Medical History:  Diagnosis Date  . A-fib ([place])   . Enlarged prostate   . Heart failure ([place])   . Hyperlipidemia   . Hypertension   . Leukemia ([place])    20 years ago  . Vertigo     Patient Active Problem List   Diagnosis Date Noted  . Syncope 05/16/2015    Past Surgical History:  Procedure Laterality Date  . BICEPS TENDON REPAIR    . HERNIA REPAIR    . neck fusion      Current Outpatient Rx  . Order #: [id number] Class: Historical Med  . Order #: [id number] Class: Historical Med  . Order #: [id number] Class: Historical Med  . Order #: [id number] Class: Historical Med  . Order #: [id number] Class: Historical Med  . Order #: [id number] Class: Historical Med  . Order #: [id number] Class: Historical Med  . Order #: [id number] Class: Historical Med  . Order #: [id number] Class: Historical Med  . Order #: [id number] Class: Historical Med  . Order #: [id number] Class: Print  . Order #: [id number] Class: Historical Med  . Order #: [id number] Class: Print  . Order #: [id number] Class: Normal  . Order #: [id number] Class: Historical Med  . Order #: [id number] Class: Historical Med  . Order #: [id number] Class: Historical Med  . Order #: [id number] Class: Historical Med  . Order #: [id number] Class: Historical Med  . Order #:  [id number] Class: Historical Med  . Order #: [id number] Class: Historical Med  . Order #: [id number] Class: Historical Med  . Order #: [id number] Class: Historical Med  . Order #: [id number] Class: Historical Med  . Order #: [id number] Class: Historical Med  . Order #: [id number] Class: Historical Med  . Order #: [id number] Class: Historical Med    Allergies Review of patient's allergies indicates no known allergies.  Family History  Problem Relation Age of Onset  . Hypertension      Social History Social History  Substance Use Topics  . Smoking status: Former Research scientist (life sciences)  . Smokeless tobacco: Not on file  . Alcohol use No    Review of Systems Constitutional: No fever/chills Eyes: No visual changes. ENT: No sore throat. Cardiovascular: Denies chest pain. Respiratory: Denies shortness of breath. Gastrointestinal: No abdominal pain.  No nausea, no vomiting.  No diarrhea.  No constipation. Genitourinary: Negative for dysuria. Musculoskeletal: Negative for back pain. Skin: Negative for rash. Neurological: Negative for headaches, focal weakness or numbness.  10-point ROS otherwise negative.  ____________________________________________   PHYSICAL EXAM:  VITAL SIGNS: ED Triage Vitals  Enc Vitals Group     BP 10/26/15 1700 (!) 145/89     Pulse Rate 10/26/15 1700 90     Resp --      Temp --      Temp src --      SpO2 10/26/15 1700 99 %     Weight --      Height --  Head Circumference --      Peak Flow --      Pain Score 10/26/15 1702 10     Pain Loc --      Pain Edu? --      Excl. in [place]? --     Constitutional: Alert and oriented. Well appearing and in no acute distress. Eyes: Conjunctivae are normal. PERRL. EOMI. Head: Atraumatic. Nose: No congestion/rhinnorhea. Mouth/Throat: Mucous membranes are moist.   Neck: No stridor.   Cardiovascular: Normal rate, regular rhythm. Grossly normal heart sounds.  Good peripheral circulation with equal and bilateral radial pulses.     Respiratory: Normal respiratory effort.  No retractions. Lungs CTAB. Gastrointestinal: Soft and nontender. No distention. Musculoskeletal: No lower extremity tenderness nor edema.  Right shoulder with surrounding ecchymosis and swelling. Restricted movement secondary to pain over the right scapula. Also with swelling and ecchymosis down the right arm. 5 out of 5 strength to the right hand. Sensation is intact to the right upper extremity. There is also edema to the right upper extremity.   Neurologic:  Normal speech and language. No gross focal neurologic deficits are appreciated. No gait instability. Skin:  Superficial, 1 m skin laceration to the posterior right arm with a small amount of pus. No surrounding erythema or induration or fluctuance. Psychiatric: Mood and affect are normal. Speech and behavior are normal.  ____________________________________________   LABS (all labs ordered are listed, but only abnormal results are displayed)  Labs Reviewed  CBC WITH DIFFERENTIAL/PLATELET - Abnormal; Notable for the following:       Result Value   RBC 3.00 (*)    Hemoglobin 10.3 (*)    HCT 29.4 (*)    MCH 34.3 (*)    Monocytes Absolute 1.6 (*)    All other components within normal limits  BASIC METABOLIC PANEL - Abnormal; Notable for the following:    Sodium 130 (*)    Glucose, Bld 101 (*)    BUN 42 (*)    Creatinine, Ser 1.85 (*)    Calcium 8.4 (*)    GFR calc non Af Amer 34 (*)    GFR calc Af Amer 40 (*)    All other components within normal limits  PROTIME-INR - Abnormal; Notable for the following:    Prothrombin Time 18.6 (*)    All other components within normal limits   ____________________________________________  EKG   ____________________________________________  RADIOLOGY   ____________________________________________   PROCEDURES    Procedures    ____________________________________________   INITIAL IMPRESSION / ASSESSMENT AND PLAN / ED  COURSE  Pertinent labs & imaging results that were available during my care of the patient were reviewed by me and considered in my medical decision making (see chart for details).  ----------------------------------------- 8:35 PM on 10/26/2015 -----------------------------------------  At 1823 the patient had 6 beats of a wide complex tachycardia. Discussed this with Dr. Tomasita Crumble shows who is covering for [facility]. The patient to Dr. Chancy Milroy when he was admitted recently.  Patient has not had any syncopal events since. I discussed medications with Dr. Tomasita Crumble shows including that the patient is still on 25 mg of metoprolol twice a day. Dr. Tomasita Crumble shows did not want to make any medication changes at this time. He does recommend that the patient follow up with Dr. Humphrey Rolls tomorrow. The patient had a recent echocardiogram with a normal ejection fraction. Dr. Tomasita Crumble shows feels that the wide complex tachycardia is most likely A. fib with aberrancy. Thinks it is appropriate for  the patient to be discharged home and follow-up in the office with a monitor placement as an outpatient. Patient will be placed again in an immobilizer. I emphasized to him how important it would be that he stay in this for pain control as well as healing. He'll be following up with orthopedics as well. Patient appears to have medicines for chronic pain and will not be prescribing him any pain medications at this time. There was a possible report from EMS that his family was taking his pain medicines. I asked the patient if he would like [place] to file a report on his behalf with Dr. services he refused. ____________________________________________   FINAL CLINICAL IMPRESSION(S) / ED DIAGNOSES  Right scapular fracture. Wide-complex tachycardia.    NEW MEDICATIONS STARTED DURING THIS VISIT:  New Prescriptions   No medications on file     Faulkner:  This document was prepared using Dragon voice recognition software and may  include unintentional dictation errors.    Orbie Pyo, MD 10/26/15 2039

## 2015-10-26 NOTE — ED Notes (Signed)
Patient denies taking any pain medication since being discharged. Patient states his wife has been taking all of his medicating. [employer] notified. Patient does not want to press charges at this time.

## 2015-11-17 ENCOUNTER — Encounter: Payer: Self-pay | Admitting: Emergency Medicine

## 2015-11-17 ENCOUNTER — Emergency Department
Admission: EM | Admit: 2015-11-17 | Discharge: 2015-11-17 | Disposition: A | Discharge status: Home / Self Care | Payer: Medicare HMO | Attending: Student in an Organized Health Care Education/Training Program | Admitting: Student in an Organized Health Care Education/Training Program

## 2015-11-17 ENCOUNTER — Emergency Department: Payer: Medicare HMO

## 2015-11-17 DIAGNOSIS — E860 Dehydration: Secondary | ICD-10-CM

## 2015-11-17 DIAGNOSIS — R55 Syncope and collapse: Secondary | ICD-10-CM | POA: Insufficient documentation

## 2015-11-17 DIAGNOSIS — Z79899 Other long term (current) drug therapy: Secondary | ICD-10-CM | POA: Insufficient documentation

## 2015-11-17 DIAGNOSIS — Z87891 Personal history of nicotine dependence: Secondary | ICD-10-CM | POA: Insufficient documentation

## 2015-11-17 DIAGNOSIS — R42 Dizziness and giddiness: Secondary | ICD-10-CM | POA: Diagnosis present

## 2015-11-17 DIAGNOSIS — I509 Heart failure, unspecified: Secondary | ICD-10-CM | POA: Diagnosis not present

## 2015-11-17 DIAGNOSIS — I110 Hypertensive heart disease with heart failure: Secondary | ICD-10-CM | POA: Diagnosis not present

## 2015-11-17 DIAGNOSIS — Z7901 Long term (current) use of anticoagulants: Secondary | ICD-10-CM | POA: Diagnosis not present

## 2015-11-17 DIAGNOSIS — Z7982 Long term (current) use of aspirin: Secondary | ICD-10-CM | POA: Diagnosis not present

## 2015-11-17 DIAGNOSIS — Z7951 Long term (current) use of inhaled steroids: Secondary | ICD-10-CM | POA: Diagnosis not present

## 2015-11-17 LAB — URINALYSIS COMPLETE WITH MICROSCOPIC (ARMC ONLY)
BILIRUBIN URINE: NEGATIVE
Bacteria, UA: NONE SEEN
GLUCOSE, UA: NEGATIVE mg/dL
Hgb urine dipstick: NEGATIVE
KETONES UR: NEGATIVE mg/dL
Leukocytes, UA: NEGATIVE
Nitrite: NEGATIVE
Protein, ur: NEGATIVE mg/dL
Specific Gravity, Urine: 1.008 (ref 1.005–1.030)
pH: 5 (ref 5.0–8.0)

## 2015-11-17 LAB — CBC WITH DIFFERENTIAL/PLATELET
Basophils Absolute: 0 10*3/uL (ref 0–0.1)
Basophils Relative: 0 %
EOS ABS: 0.2 10*3/uL (ref 0–0.7)
EOS PCT: 1 %
HCT: 33.3 % — ABNORMAL LOW (ref 40.0–52.0)
Hemoglobin: 11.7 g/dL — ABNORMAL LOW (ref 13.0–18.0)
LYMPHS ABS: 2.1 10*3/uL (ref 1.0–3.6)
Lymphocytes Relative: 20 %
MCH: 34.3 pg — AB (ref 26.0–34.0)
MCHC: 35.1 g/dL (ref 32.0–36.0)
MCV: 97.8 fL (ref 80.0–100.0)
MONO ABS: 1.5 10*3/uL — AB (ref 0.2–1.0)
MONOS PCT: 14 %
Neutro Abs: 6.9 10*3/uL — ABNORMAL HIGH (ref 1.4–6.5)
Neutrophils Relative %: 65 %
PLATELETS: 260 10*3/uL (ref 150–440)
RBC: 3.4 MIL/uL — ABNORMAL LOW (ref 4.40–5.90)
RDW: 14.5 % (ref 11.5–14.5)
WBC: 10.8 10*3/uL — ABNORMAL HIGH (ref 3.8–10.6)

## 2015-11-17 LAB — COMPREHENSIVE METABOLIC PANEL
ALK PHOS: 77 U/L (ref 38–126)
ALT: 15 U/L — ABNORMAL LOW (ref 17–63)
ANION GAP: 10 (ref 5–15)
AST: 22 U/L (ref 15–41)
Albumin: 3.5 g/dL (ref 3.5–5.0)
BUN: 40 mg/dL — ABNORMAL HIGH (ref 6–20)
CALCIUM: 8.7 mg/dL — AB (ref 8.9–10.3)
CHLORIDE: 101 mmol/L (ref 101–111)
CO2: 23 mmol/L (ref 22–32)
Creatinine, Ser: 2.26 mg/dL — ABNORMAL HIGH (ref 0.61–1.24)
GFR calc non Af Amer: 27 mL/min — ABNORMAL LOW (ref >60)
GFR, EST AFRICAN AMERICAN: 31 mL/min — AB (ref >60)
Glucose, Bld: 89 mg/dL (ref 65–99)
POTASSIUM: 3.1 mmol/L — AB (ref 3.5–5.1)
SODIUM: 134 mmol/L — AB (ref 135–145)
Total Bilirubin: 0.6 mg/dL (ref 0.3–1.2)
Total Protein: 6 g/dL — ABNORMAL LOW (ref 6.5–8.1)

## 2015-11-17 LAB — PROTIME-INR
INR: 2.39
Prothrombin Time: 26.5 seconds — ABNORMAL HIGH (ref 11.4–15.2)

## 2015-11-17 LAB — TROPONIN I: Troponin I: 0.03 ng/mL (ref <0.03)

## 2015-11-17 NOTE — ED Notes (Addendum)
Pt given meal tray and [employer] to drink per Dr Quentin Cornwall.

## 2015-11-17 NOTE — ED Provider Notes (Signed)
[facility] Emergency Department Provider Note    First MD Initiated Contact with Patient 11/17/15 1718     (approximate)  I have reviewed the triage vital signs and the nursing notes.   HISTORY  Chief Complaint Hypotension    HPI Dylan Faulkner is a 75 y.o. male who presents with lightheadedness upon standing. Patient states that he's had similar symptoms for the past 3-4 weeks. 4 weeks ago he had a syncopal episode after rising from standing position where he fell hit his head and broke his right shoulder. Today patient states that he was sitting outside enjoying the sun.  He got a bit dehydrated. States that he stood up to walk in the house and felt very lightheaded and had to sit down again. Denies any falls. Denies any chest pain or shortness of breath. Denies any hip pain. Denies any numbness or tingling.   Past Medical History:  Diagnosis Date  . A-fib ([place])   . Enlarged prostate   . Heart failure ([place])   . Hyperlipidemia   . Hypertension   . 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 Vertigo     Patient Active Problem List   Diagnosis Date Noted  . Syncope 05/16/2015    Past Surgical History:  Procedure Laterality Date  . BICEPS TENDON REPAIR    . HERNIA REPAIR    . neck fusion      Prior to Admission medications   Medication Sig Start Date End Date Taking? Authorizing Provider  acetaminophen (TYLENOL) 500 MG tablet Take 500 mg by mouth once as needed for headache.     Historical Provider, MD  albuterol (PROVENTIL HFA;VENTOLIN HFA) 108 (90 Base) MCG/ACT inhaler Inhale 2 puffs into the lungs every 4 (four) hours as needed for wheezing.    Historical Provider, MD  aspirin EC 81 MG tablet Take 81 mg by mouth daily.    Historical Provider, MD  cyanocobalamin 500 MCG tablet Take 500 mcg by mouth daily.    Historical Provider, MD  diazepam (VALIUM) 2 MG tablet Take 2 mg by mouth at bedtime.    Historical Provider, MD  finasteride (PROSCAR) 5  MG tablet Take 5 mg by mouth daily.    Historical Provider, MD  fluticasone (FLONASE) 50 MCG/ACT nasal spray Place 2 sprays into both nostrils daily as needed for allergies or rhinitis.    Historical Provider, MD  Fluticasone-Salmeterol (ADVAIR) 250-50 MCG/DOSE AEPB Inhale 1 puff into the lungs 2 (two) times daily.    Historical Provider, MD  folic acid (FOLVITE) 1 MG tablet Take 1 mg by mouth daily.    Historical Provider, MD  furosemide (LASIX) 20 MG tablet Take 20 mg by mouth 2 (two) times daily.    Historical Provider, MD  gabapentin (NEURONTIN) 600 MG tablet Take 1 tablet (600 mg total) by mouth 2 (two) times daily. 10/24/15   Lytle Butte, MD  HYDROcodone-acetaminophen [facility]) 10-325 MG tablet Take 1 tablet by mouth every 6 (six) hours as needed for moderate pain or severe pain.    Historical Provider, MD  HYDROcodone-acetaminophen (NORCO) 10-325 MG tablet Take 1 tablet by mouth every 6 (six) hours as needed for moderate pain or severe pain. 10/24/15   Lytle Butte, MD  isosorbide mononitrate (IMDUR) 30 MG 24 hr tablet Take 1 tablet (30 mg total) by mouth daily. 05/16/15   Bettey Costa, MD  lisinopril (PRINIVIL,ZESTRIL) 10 MG tablet Take 10 mg by mouth daily.  Historical Provider, MD  loratadine (CLARITIN) 10 MG tablet Take 10 mg by mouth daily.    Historical Provider, MD  magnesium oxide (MAG-OX) 400 MG tablet Take 400 mg by mouth daily.    Historical Provider, MD  meclizine (ANTIVERT) 25 MG tablet Take 25 mg by mouth 3 (three) times daily as needed for dizziness.    Historical Provider, MD  montelukast (SINGULAIR) 10 MG tablet Take 10 mg by mouth daily.    Historical Provider, MD  Multiple Vitamin (MULTIVITAMIN WITH MINERALS) TABS tablet Take 1 tablet by mouth daily.    Historical Provider, MD  nitroGLYCERIN (NITROSTAT) 0.4 MG SL tablet Place 0.4 mg under the tongue every 5 (five) minutes as needed for chest pain.    Historical Provider, MD  omeprazole (PRILOSEC) 20 MG capsule Take 20 mg by  mouth daily.    Historical Provider, MD  pyridOXINE (VITAMIN B-6) 50 MG tablet Take 50 mg by mouth daily.    Historical Provider, MD  sertraline (ZOLOFT) 50 MG tablet Take 50 mg by mouth daily.    Historical Provider, MD  simvastatin (ZOCOR) 20 MG tablet Take 20 mg by mouth daily.    Historical Provider, MD  tamsulosin (FLOMAX) 0.4 MG CAPS capsule Take 0.4 mg by mouth daily.     Historical Provider, MD  warfarin (COUMADIN) 5 MG tablet Take 5 mg by mouth daily.     Historical Provider, MD    Allergies Review of patient's allergies indicates no known allergies.  Family History  Problem Relation Age of Onset  . Hypertension      Social History Social History  Substance Use Topics  . Smoking status: Former Research scientist (life sciences)  . Smokeless tobacco: Never Used  . Alcohol use No    Review of Systems Patient denies headaches, rhinorrhea, blurry vision, numbness, shortness of breath, chest pain, edema, cough, abdominal pain, nausea, vomiting, diarrhea, dysuria, fevers, rashes or hallucinations unless otherwise stated above in HPI. ____________________________________________   PHYSICAL EXAM:  VITAL SIGNS: Vitals:   11/17/15 1900 11/17/15 1930  BP: 121/75 118/86  Pulse: 85 78  Resp: 19 20  Temp:      Constitutional: Alert and oriented. Well appearing and in no acute distress. Eyes: Conjunctivae are normal. PERRL. EOMI. Head: Atraumatic. Nose: No congestion/rhinnorhea. Mouth/Throat: Mucous membranes are dry.  Oropharynx non-erythematous. Neck: No stridor. Painless ROM. No cervical spine tenderness to palpation Hematological/Lymphatic/Immunilogical: No cervical lymphadenopathy. Cardiovascular: Normal rate, regular rhythm. Grossly normal heart sounds.  Good peripheral circulation. Respiratory: Normal respiratory effort.  No retractions. Lungs CTAB. Gastrointestinal: Soft and nontender. No distention. No abdominal bruits. No CVA tenderness.  Musculoskeletal: No lower extremity tenderness . 1+  bilateral edema.  No joint effusions.  Palpation of the right shoulder. Right shoulder is in sling sensation intact to light touch distally. 2+ pulses in all 4 extremities. Neurologic:  Normal speech and language. No gross focal neurologic deficits are appreciated. No gait instability. Skin:  Skin is warm, dry and intact. No rash noted. Psychiatric: Mood and affect are normal. Speech and behavior are normal.  ____________________________________________   LABS (all labs ordered are listed, but only abnormal results are displayed)  Results for orders placed or performed during the hospital encounter of 11/17/15 (from the past 24 hour(s))  CBC with Differential/Platelet     Status: Abnormal   Collection Time: 11/17/15  5:25 PM  Result Value Ref Range   WBC 10.8 (H) 3.8 - 10.6 K/uL   RBC 3.40 (L) 4.40 - 5.90 MIL/uL  Hemoglobin 11.7 (L) 13.0 - 18.0 g/dL   HCT 33.3 (L) 40.0 - 52.0 %   MCV 97.8 80.0 - 100.0 fL   MCH 34.3 (H) 26.0 - 34.0 pg   MCHC 35.1 32.0 - 36.0 g/dL   RDW 14.5 11.5 - 14.5 %   Platelets 260 150 - 440 K/uL   Neutrophils Relative % 65 %   Neutro Abs 6.9 (H) 1.4 - 6.5 K/uL   Lymphocytes Relative 20 %   Lymphs Abs 2.1 1.0 - 3.6 K/uL   Monocytes Relative 14 %   Monocytes Absolute 1.5 (H) 0.2 - 1.0 K/uL   Eosinophils Relative 1 %   Eosinophils Absolute 0.2 0 - 0.7 K/uL   Basophils Relative 0 %   Basophils Absolute 0.0 0 - 0.1 K/uL  Comprehensive metabolic panel     Status: Abnormal   Collection Time: 11/17/15  5:25 PM  Result Value Ref Range   Sodium 134 (L) 135 - 145 mmol/L   Potassium 3.1 (L) 3.5 - 5.1 mmol/L   Chloride 101 101 - 111 mmol/L   CO2 23 22 - 32 mmol/L   Glucose, Bld 89 65 - 99 mg/dL   BUN 40 (H) 6 - 20 mg/dL   Creatinine, Ser 2.26 (H) 0.61 - 1.24 mg/dL   Calcium 8.7 (L) 8.9 - 10.3 mg/dL   Total Protein 6.0 (L) 6.5 - 8.1 g/dL   Albumin 3.5 3.5 - 5.0 g/dL   AST 22 15 - 41 U/L   ALT 15 (L) 17 - 63 U/L   Alkaline Phosphatase 77 38 - 126 U/L   Total  Bilirubin 0.6 0.3 - 1.2 mg/dL   GFR calc non Af Amer 27 (L) >60 mL/min   GFR calc Af Amer 31 (L) >60 mL/min   Anion gap 10 5 - 15  Troponin I     Status: Abnormal   Collection Time: 11/17/15  5:25 PM  Result Value Ref Range   Troponin I 0.03 (HH) <0.03 ng/mL  Urinalysis complete, with microscopic ([facility] only)     Status: Abnormal   Collection Time: 11/17/15  6:39 PM  Result Value Ref Range   Color, Urine YELLOW (A) YELLOW   APPearance CLEAR (A) CLEAR   Glucose, UA NEGATIVE NEGATIVE mg/dL   Bilirubin Urine NEGATIVE NEGATIVE   Ketones, ur NEGATIVE NEGATIVE mg/dL   Specific Gravity, Urine 1.008 1.005 - 1.030   Hgb urine dipstick NEGATIVE NEGATIVE   pH 5.0 5.0 - 8.0   Protein, ur NEGATIVE NEGATIVE mg/dL   Nitrite NEGATIVE NEGATIVE   Leukocytes, UA NEGATIVE NEGATIVE   RBC / HPF 0-5 0 - 5 RBC/hpf   WBC, UA 0-5 0 - 5 WBC/hpf   Bacteria, UA NONE SEEN NONE SEEN   Squamous Epithelial / LPF 0-5 (A) NONE SEEN   ____________________________________________  EKG My interpretation at Time: 17:22   Indication: syncope  Rate: 70  Rhythm: afib Axis: normal Other: non specific st changes. ____________________________________________  RADIOLOGY  CXR  my read shows no evidence of acute cardiopulmonary process.  ____________________________________________   PROCEDURES  Procedure(s) performed: none    Critical Care performed: no ____________________________________________   INITIAL IMPRESSION / ASSESSMENT AND PLAN / ED COURSE  Pertinent labs & imaging results that were available during my care of the patient were reviewed by me and considered in my medical decision making (see chart for details).  DDX: A. fib, dehydration, electrolyte abnormality, orthostasis, ACS, hypoglycemia  Dylan Faulkner is a 75 y.o. who presents to  the ED with near syncopal event after rising from a chair. Reported decreased by mouth intake. Patient is on Coumadin for history of A. fib. He arrives  hemodynamically stable and asymptomatic. His neuro exam is nonfocal. No evidence of head trauma. Based on his exam and do not feel that diagnostic CT imaging clinically indicated at this time. I will check labs to evaluate for dehydration or electrolyte abnormality. She is afebrile and without hypoxia is a very low suspicion for acute infectious process.  Clinical Course  Comment By Time  Review of blood work does show evidence of leukocytosis but on further review compared to recent labs it appears it is more likely secondary to concentrated specimen and dehydration as patient does have subtle bump in his BUN and creatinine. No acidosis. Troponin is negative. Will continue to monitor. Merlyn Lot, MD 08/14 [phone]  Patient currently asymptomatic and hemodynamically stable. Son is at bedside and reiterates that the patient did not have any head injury today as he was able to catch him before he fell to the ground. They reiterate that the patient became lightheaded and dizzy upon standing from a sitting position and that he had an sitting out in the sun without much oral hydration this afternoon. Had extensive discussion with the patient's wife regarding his poor oral food and liquid intake over the past several months. Wife states that he's recently been diagnosed with dementia but has not been started on medications. She states that he is been depressed because he hasn't been able to use his right arm after the fracture. She's been trying to encourage his fluid intake but has been unsuccessful. They said they're also having difficulty paying for food and medications. She is also concerned about the Coumadin. I discussed the importance of Coumadin with his chronic A. fib but also discussed the need to have a detailed conversation with their primary care provider regarding the risks benefits of anticoagulation given the patient's age and frequent falls. Merlyn Lot, MD 08/14 1909    ----------------------------------------- 8:31 PM on 11/17/2015 -----------------------------------------  Patient tolerating Po. He symptomatically when ambulating and able to ambulate with a steady gait. Discussed need for close follow-up with PCP and discussed signs and symptoms for which patient should return emergently to the hospital.  Have discussed with the patient and available family all diagnostics and treatments performed thus far and all questions were answered to the best of my ability. The patient demonstrates understanding and agreement with plan.   ____________________________________________   FINAL CLINICAL IMPRESSION(S) / ED DIAGNOSES  Final diagnoses:  Dehydration  Near syncope      NEW MEDICATIONS STARTED DURING THIS VISIT:  New Prescriptions   No medications on file     Note:  This document was prepared using Dragon voice recognition software and may include unintentional dictation errors.    Merlyn Lot, MD 11/17/15 2032

## 2015-11-26 ENCOUNTER — Encounter: Payer: Self-pay | Admitting: *Deleted

## 2015-11-26 ENCOUNTER — Emergency Department
Admission: EM | Admit: 2015-11-26 | Discharge: 2015-11-26 | Disposition: A | Discharge status: Home / Self Care | Payer: Medicare HMO | Attending: Emergency Medicine | Admitting: Emergency Medicine

## 2015-11-26 ENCOUNTER — Emergency Department: Payer: Medicare HMO

## 2015-11-26 DIAGNOSIS — I110 Hypertensive heart disease with heart failure: Secondary | ICD-10-CM | POA: Diagnosis not present

## 2015-11-26 DIAGNOSIS — Z87891 Personal history of nicotine dependence: Secondary | ICD-10-CM | POA: Insufficient documentation

## 2015-11-26 DIAGNOSIS — Y9389 Activity, other specified: Secondary | ICD-10-CM | POA: Diagnosis not present

## 2015-11-26 DIAGNOSIS — Y999 Unspecified external cause status: Secondary | ICD-10-CM | POA: Insufficient documentation

## 2015-11-26 DIAGNOSIS — Z79899 Other long term (current) drug therapy: Secondary | ICD-10-CM | POA: Insufficient documentation

## 2015-11-26 DIAGNOSIS — I4891 Unspecified atrial fibrillation: Secondary | ICD-10-CM | POA: Diagnosis not present

## 2015-11-26 DIAGNOSIS — I509 Heart failure, unspecified: Secondary | ICD-10-CM | POA: Insufficient documentation

## 2015-11-26 DIAGNOSIS — Y92410 Unspecified street and highway as the place of occurrence of the external cause: Secondary | ICD-10-CM | POA: Diagnosis not present

## 2015-11-26 DIAGNOSIS — S42112A Displaced fracture of body of scapula, left shoulder, initial encounter for closed fracture: Secondary | ICD-10-CM | POA: Insufficient documentation

## 2015-11-26 DIAGNOSIS — Z7982 Long term (current) use of aspirin: Secondary | ICD-10-CM | POA: Diagnosis not present

## 2015-11-26 DIAGNOSIS — S42122A Displaced fracture of acromial process, left shoulder, initial encounter for closed fracture: Secondary | ICD-10-CM

## 2015-11-26 DIAGNOSIS — S299XXA Unspecified injury of thorax, initial encounter: Secondary | ICD-10-CM | POA: Diagnosis present

## 2015-11-26 MED ORDER — HYDROCODONE-ACETAMINOPHEN 5-325 MG PO TABS: 1.0000 | ORAL_TABLET | ORAL | 0 refills | Status: DC | PRN

## 2015-11-26 MED ORDER — BACITRACIN ZINC 500 UNIT/GM EX OINT
TOPICAL_OINTMENT | Freq: Two times a day (BID) | CUTANEOUS | Status: DC
  Administered 2015-11-26: 1 via TOPICAL
  Filled 2015-11-26: qty 0.9
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
## 2016-03-26 ENCOUNTER — Emergency Department: Payer: Medicare HMO

## 2016-03-26 ENCOUNTER — Emergency Department
Admission: EM | Admit: 2016-03-26 | Discharge: 2016-03-27 | Disposition: A | Discharge status: Home / Self Care | Payer: Medicare HMO | Attending: Emergency Medicine | Admitting: Emergency Medicine

## 2016-03-26 ENCOUNTER — Encounter: Payer: Self-pay | Admitting: Emergency Medicine

## 2016-03-26 DIAGNOSIS — I110 Hypertensive heart disease with heart failure: Secondary | ICD-10-CM | POA: Diagnosis not present

## 2016-03-26 DIAGNOSIS — Z856 Personal history of leukemia: Secondary | ICD-10-CM | POA: Insufficient documentation

## 2016-03-26 DIAGNOSIS — R0602 Shortness of breath: Secondary | ICD-10-CM | POA: Diagnosis present

## 2016-03-26 DIAGNOSIS — Z7982 Long term (current) use of aspirin: Secondary | ICD-10-CM | POA: Insufficient documentation

## 2016-03-26 DIAGNOSIS — Z7901 Long term (current) use of anticoagulants: Secondary | ICD-10-CM | POA: Diagnosis not present

## 2016-03-26 DIAGNOSIS — J45901 Unspecified asthma with (acute) exacerbation: Secondary | ICD-10-CM | POA: Insufficient documentation

## 2016-03-26 DIAGNOSIS — N5089 Other specified disorders of the male genital organs: Secondary | ICD-10-CM | POA: Insufficient documentation

## 2016-03-26 DIAGNOSIS — I509 Heart failure, unspecified: Secondary | ICD-10-CM | POA: Diagnosis not present

## 2016-03-26 DIAGNOSIS — Z79899 Other long term (current) drug therapy: Secondary | ICD-10-CM | POA: Insufficient documentation

## 2016-03-26 LAB — BASIC METABOLIC PANEL
Anion gap: 6 (ref 5–15)
BUN: 20 mg/dL (ref 6–20)
CALCIUM: 8.8 mg/dL — AB (ref 8.9–10.3)
CHLORIDE: 110 mmol/L (ref 101–111)
CO2: 24 mmol/L (ref 22–32)
CREATININE: 1.34 mg/dL — AB (ref 0.61–1.24)
GFR calc non Af Amer: 51 mL/min — ABNORMAL LOW (ref >60)
GFR, EST AFRICAN AMERICAN: 59 mL/min — AB (ref >60)
Glucose, Bld: 103 mg/dL — ABNORMAL HIGH (ref 65–99)
Potassium: 4 mmol/L (ref 3.5–5.1)
SODIUM: 140 mmol/L (ref 135–145)

## 2016-03-26 LAB — TROPONIN I: TROPONIN I: 0.04 ng/mL — AB (ref <0.03)

## 2016-03-26 LAB — CBC
HCT: 35.2 % — ABNORMAL LOW (ref 40.0–52.0)
Hemoglobin: 12.3 g/dL — ABNORMAL LOW (ref 13.0–18.0)
MCH: 34.4 pg — AB (ref 26.0–34.0)
MCHC: 34.8 g/dL (ref 32.0–36.0)
MCV: 98.7 fL (ref 80.0–100.0)
PLATELETS: 223 10*3/uL (ref 150–440)
RBC: 3.57 MIL/uL — ABNORMAL LOW (ref 4.40–5.90)
RDW: 14 % (ref 11.5–14.5)
WBC: 10.2 10*3/uL (ref 3.8–10.6)

## 2016-03-26 LAB — BRAIN NATRIURETIC PEPTIDE: B NATRIURETIC PEPTIDE 5: 398 pg/mL — AB (ref 0.0–100.0)

## 2016-03-26 LAB — PROTIME-INR
INR: 2.1
PROTHROMBIN TIME: 23.9 s — AB (ref 11.4–15.2)

## 2016-03-26 MED ORDER — IPRATROPIUM-ALBUTEROL 0.5-2.5 (3) MG/3ML IN SOLN
3.0000 mL | Freq: Once | RESPIRATORY_TRACT | Status: AC
  Administered 2016-03-26: 3 mL via RESPIRATORY_TRACT
  Filled 2016-03-26: qty 3

## 2016-03-26 MED ORDER — FUROSEMIDE 10 MG/ML IJ SOLN
INTRAMUSCULAR | Status: AC
Start: 2016-03-26 — End: 2016-03-26
  Administered 2016-03-26: 40 mg via INTRAVENOUS
  Filled 2016-03-26: qty 4

## 2016-03-26 MED ORDER — FUROSEMIDE 10 MG/ML IJ SOLN
40.0000 mg | Freq: Once | INTRAMUSCULAR | Status: AC
  Administered 2016-03-26: 40 mg via INTRAVENOUS

## 2016-03-26 MED ORDER — ACETAMINOPHEN 500 MG PO TABS
1000.0000 mg | ORAL_TABLET | Freq: Once | ORAL | Status: AC
  Administered 2016-03-26: 1000 mg via ORAL
  Filled 2016-03-26: qty 2

## 2016-03-26 MED ORDER — IPRATROPIUM-ALBUTEROL 0.5-2.5 (3) MG/3ML IN SOLN
6.0000 mL | Freq: Once | RESPIRATORY_TRACT | Status: AC
  Administered 2016-03-26: 6 mL via RESPIRATORY_TRACT
  Filled 2016-03-26: qty 6

## 2016-03-26 MED ORDER — ALBUTEROL SULFATE HFA 108 (90 BASE) MCG/ACT IN AERS: 2.0000 | INHALATION_SPRAY | Freq: Four times a day (QID) | RESPIRATORY_TRACT | 2 refills | Status: AC | PRN

## 2016-03-26 MED ORDER — PREDNISONE 20 MG PO TABS: 60.0000 mg | ORAL_TABLET | Freq: Every day | ORAL | 0 refills | Status: AC

## 2016-03-26 MED ORDER — FUROSEMIDE 20 MG PO TABS: 20.0000 mg | ORAL_TABLET | Freq: Two times a day (BID) | ORAL | 1 refills | Status: DC
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
## 2016-03-26 NOTE — ED Provider Notes (Signed)
[facility] Emergency Department Provider Note  ____________________________________________  Time seen: Approximately 9:07 PM  I have reviewed the triage vital signs and the nursing notes.   HISTORY  Chief Complaint Shortness of Breath and Leg Swelling   HPI Dylan Faulkner is a 75 y.o. male with h/o asthma, afib on coumadin, Asthma, HFpEF, HTN, HLD who presents for evaluation of shortness of breath. Patient reports a week of progressively worsening shortness of breath and dry cough. Shortness of breath is worse with exertion. No orthopnea although patient does sleep on 3 pillows which has been doing for a very long time. No fever or chills, no chest pain, no hemoptysis. Patient has been taken off of Lasix for 6 months by his Neurologist after sustaining a traumatic intraparenchymal hemorrhage. He has not seen a cardiologist for the same period of time. He also reports swelling of his lower extremities and his scrotum and penis that has been worse over the course of the last week. Patient last echocardiogram was in July 2017 showing normal EF.  Past Medical History:  Diagnosis Date  . A-fib ([place])   . Enlarged prostate   . Heart failure ([place])   . Hyperlipidemia   . Hypertension   . 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 Vertigo     Patient Active Problem List   Diagnosis Date Noted  . Syncope 05/16/2015    Past Surgical History:  Procedure Laterality Date  . BICEPS TENDON REPAIR    . HERNIA REPAIR    . neck fusion      Prior to Admission medications   Medication Sig Start Date End Date Taking? Authorizing Provider  albuterol (PROVENTIL HFA;VENTOLIN HFA) 108 (90 Base) MCG/ACT inhaler Inhale 2 puffs into the lungs every 6 (six) hours as needed for wheezing or shortness of breath. 03/26/16   Rudene Re, MD  aspirin EC 81 MG tablet Take 81 mg by mouth daily.    Historical Provider, MD  cyanocobalamin 500 MCG tablet Take 500 mcg by mouth daily.     Historical Provider, MD  diazepam (VALIUM) 2 MG tablet Take 2 mg by mouth at bedtime.    Historical Provider, MD  finasteride (PROSCAR) 5 MG tablet Take 5 mg by mouth daily.    Historical Provider, MD  fluticasone (FLONASE) 50 MCG/ACT nasal spray Place 2 sprays into both nostrils daily as needed for allergies or rhinitis.    Historical Provider, MD  Fluticasone-Salmeterol (ADVAIR) 250-50 MCG/DOSE AEPB Inhale 1 puff into the lungs 2 (two) times daily.    Historical Provider, MD  folic acid (FOLVITE) 1 MG tablet Take 1 mg by mouth daily.    Historical Provider, MD  furosemide (LASIX) 20 MG tablet Take 1 tablet (20 mg total) by mouth 2 (two) times daily. 03/26/16 03/26/17  Rudene Re, MD  gabapentin (NEURONTIN) 600 MG tablet Take 1 tablet (600 mg total) by mouth 2 (two) times daily. 10/24/15   Lytle Butte, MD  HYDROcodone-acetaminophen (NORCO) 5-325 MG tablet Take 1-2 tablets by mouth every 4 (four) hours as needed for moderate pain. 11/26/15   Pierce Crane Beers, PA-C  isosorbide mononitrate (IMDUR) 30 MG 24 hr tablet Take 1 tablet (30 mg total) by mouth daily. 05/16/15   Bettey Costa, MD  lisinopril (PRINIVIL,ZESTRIL) 10 MG tablet Take 10 mg by mouth daily.    Historical Provider, MD  loratadine (CLARITIN) 10 MG tablet Take 10 mg by mouth daily.    Historical Provider, MD  magnesium oxide (MAG-OX) 400 MG tablet Take 400 mg by mouth daily.    Historical Provider, MD  meclizine (ANTIVERT) 25 MG tablet Take 25 mg by mouth 3 (three) times daily as needed for dizziness.    Historical Provider, MD  montelukast (SINGULAIR) 10 MG tablet Take 10 mg by mouth daily.    Historical Provider, MD  Multiple Vitamin (MULTIVITAMIN WITH MINERALS) TABS tablet Take 1 tablet by mouth daily.    Historical Provider, MD  nitroGLYCERIN (NITROSTAT) 0.4 MG SL tablet Place 0.4 mg under the tongue every 5 (five) minutes as needed for chest pain.    Historical Provider, MD  omeprazole (PRILOSEC) 20 MG capsule Take 20 mg by mouth  daily.    Historical Provider, MD  predniSONE (DELTASONE) 20 MG tablet Take 3 tablets (60 mg total) by mouth daily. 03/26/16 03/30/16  Rudene Re, MD  pyridOXINE (VITAMIN B-6) 50 MG tablet Take 50 mg by mouth daily.    Historical Provider, MD  sertraline (ZOLOFT) 50 MG tablet Take 50 mg by mouth daily.    Historical Provider, MD  simvastatin (ZOCOR) 20 MG tablet Take 20 mg by mouth daily.    Historical Provider, MD  tamsulosin (FLOMAX) 0.4 MG CAPS capsule Take 0.4 mg by mouth daily.     Historical Provider, MD  warfarin (COUMADIN) 5 MG tablet Take 5 mg by mouth daily.     Historical Provider, MD    Allergies Patient has no known allergies.  Family History  Problem Relation Age of Onset  . Hypertension      Social History Social History  Substance Use Topics  . Smoking status: Never Smoker  . Smokeless tobacco: Never Used  . Alcohol use No    Review of Systems  Constitutional: Negative for fever. Eyes: Negative for visual changes. ENT: Negative for sore throat. Neck: No neck pain  Cardiovascular: Negative for chest pain. Respiratory: + shortness of breath. Gastrointestinal: Negative for abdominal pain, vomiting or diarrhea. Genitourinary: Negative for dysuria. + scrotal edema Musculoskeletal: Negative for back pain. + b/l LE edema  Skin: Negative for rash. Neurological: Negative for headaches, weakness or numbness. Psych: No SI or HI  ____________________________________________   PHYSICAL EXAM:  VITAL SIGNS: ED Triage Vitals  Enc Vitals Group     BP 03/26/16 2030 (!) 127/91     Pulse Rate 03/26/16 2030 (!) 59     Resp 03/26/16 2030 20     Temp 03/26/16 2030 98.7 F (37.1 C)     Temp Source 03/26/16 2030 Oral     SpO2 03/26/16 2030 95 %     Weight 03/26/16 2031 168 lb (76.2 kg)     Height 03/26/16 2031 5\' 10"  (1.778 m)     Head Circumference --      Peak Flow --      Pain Score 03/26/16 2031 0     Pain Loc --      Pain Edu? --      Excl. in [place]? --       Constitutional: Alert and oriented. Well appearing and in no apparent distress. HEENT:      Head: Normocephalic and atraumatic.         Eyes: Conjunctivae are normal. Sclera is non-icteric. EOMI. PERRL      Mouth/Throat: Mucous membranes are moist.       Neck: Supple with no signs of meningismus. Cardiovascular: Irregularly irregular rhythm with normal rate. No murmurs, gallops, or rubs. 2+ symmetrical distal pulses are present in  all extremities. No JVD. Respiratory: Normal respiratory effort. Lungs are clear to auscultation bilaterally with faint expiratory wheezes, no crackles Gastrointestinal: Soft, non tender, and non distended with positive bowel sounds. No rebound or guarding. Genitourinary: No CVA tenderness. Scrotal and penile edema with no erythema or tenderness  Musculoskeletal: 2+ pitting edema to the knees  Neurologic: Normal speech and language. Face is symmetric. Moving all extremities. No gross focal neurologic deficits are appreciated. Skin: Skin is warm, dry and intact. No rash noted. Psychiatric: Mood and affect are normal. Speech and behavior are normal.  ____________________________________________   LABS (all labs ordered are listed, but only abnormal results are displayed)  Labs Reviewed  BASIC METABOLIC PANEL - Abnormal; Notable for the following:       Result Value   Glucose, Bld 103 (*)    Creatinine, Ser 1.34 (*)    Calcium 8.8 (*)    GFR calc non Af Amer 51 (*)    GFR calc Af Amer 59 (*)    All other components within normal limits  CBC - Abnormal; Notable for the following:    RBC 3.57 (*)    Hemoglobin 12.3 (*)    HCT 35.2 (*)    MCH 34.4 (*)    All other components within normal limits  TROPONIN I - Abnormal; Notable for the following:    Troponin I 0.04 (*)    All other components within normal limits  BRAIN NATRIURETIC PEPTIDE - Abnormal; Notable for the following:    B Natriuretic Peptide 398.0 (*)    All other components within normal  limits  PROTIME-INR - Abnormal; Notable for the following:    Prothrombin Time 23.9 (*)    All other components within normal limits  TROPONIN I   ____________________________________________  EKG  ED ECG REPORT I, Rudene Re, the attending physician, personally viewed and interpreted this ECG.  Atrial fibrillation, rate of 91, normal QRS and QTc intervals, normal axis, no ST elevations or depressions.  ____________________________________________  RADIOLOGY  CXR: Stable cardiomegaly. Minimal atelectatic linear opacities adjacent to the mildly elevated left hemidiaphragm, chronic. No consolidation or effusion ____________________________________________   PROCEDURES  Procedure(s) performed: None Procedures Critical Care performed:  None ____________________________________________   INITIAL IMPRESSION / ASSESSMENT AND PLAN / ED COURSE  75 y.o. male with h/o asthma, afib on coumadin, asthma, HFpEF, HTN, HLD who presents for evaluation of shortness of breath, b/l lower extremity edema and scrotal edema. Patient has 2+ pitting edema in bilateral lower extremities and also scrotal edema, no crackles, normal oxygenation, normal work of breathing, does have some faint expiratory wheezes. No fever, vitals are within normal limits, EKG with no evidence of ischemia. Presentation concerning for possible asthma exacerbation versus CHF. We'll give 40 mg of IV Lasix and 1 DuoNeb treatment. Labs have been ordered and chest x-ray.  Clinical Course as of Mar 26 2306  Fri Mar 26, 2016  2304 Patient feels better. Wife reports that he has filled two urinals so far. Still having mild wheezing after 2 duonebs, so 3rd one ordered. BNP elevated at 398. Troponin within patient's normal at 0.04. 2nd troponin is pending. Plan to dc home if 2nd troponin is stable. Patient will be re-started on lasix 20mg  BID and will be sent home on albuterol and prednisone for asthma exacerbation. Patient being  referred to CHF clinic for close follow up. Care transferred to Dr. Dahlia Client.  [CV]    Clinical Course User Index [CV] Rudene Re, MD    Pertinent  labs & imaging results that were available during my care of the patient were reviewed by me and considered in my medical decision making (see chart for details).    ____________________________________________   FINAL CLINICAL IMPRESSION(S) / ED DIAGNOSES  Final diagnoses:  Acute on chronic congestive heart failure, unspecified congestive heart failure type (HCC)  Mild asthma with exacerbation, unspecified whether persistent      NEW MEDICATIONS STARTED DURING THIS VISIT:  New Prescriptions   ALBUTEROL (PROVENTIL HFA;VENTOLIN HFA) 108 (90 BASE) MCG/ACT INHALER    Inhale 2 puffs into the lungs every 6 (six) hours as needed for wheezing or shortness of breath.   FUROSEMIDE (LASIX) 20 MG TABLET    Take 1 tablet (20 mg total) by mouth 2 (two) times daily.   PREDNISONE (DELTASONE) 20 MG TABLET    Take 3 tablets (60 mg total) by mouth daily.     Note:  This document was prepared using Dragon voice recognition software and may include unintentional dictation errors.    Rudene Re, MD 03/26/16 [phone]

## 2016-03-26 NOTE — ED Notes (Signed)
Pt's family has been emptying urinal, report approx 2L output

## 2016-03-26 NOTE — Discharge Instructions (Signed)
Start taking Lasix 20 mg twice a day. Use your albuterol inhaler every 4 hours 2 puffs as needed for shortness of breath. Take prednisone as prescribed for the next 4 days. Follow-up with your primary care doctor on Monday. Follow up with cardiology in a week. Return to the emergency room for chest pain, shortness of breath, fever, dizziness, or any new symptoms concerning to you.

## 2016-03-27 LAB — TROPONIN I: TROPONIN I: 0.04 ng/mL — AB (ref <0.03)

## 2016-03-27 NOTE — ED Provider Notes (Signed)
-----------------------------------------   12:46 AM on 03/27/2016 -----------------------------------------   Blood pressure 129/75, pulse 75, temperature 98.7 F (37.1 C), temperature source Oral, resp. rate 19, height 5\' 10"  (1.778 m), weight 168 lb (76.2 kg), SpO2 97 %.  Assuming care from Dr. Kelli Hope.  In short, Dylan Faulkner is a 75 y.o. male with a chief complaint of Shortness of Breath and Leg Swelling .  Refer to the original H&P for additional details.  The current plan of care is to follow up the results of the repeat troponin.  Rations repeat troponin is 0.04 which is what it had been previously. The patient will be discharged home to follow-up with the outpatient clinic.    Loney Hering, MD 03/27/16 [phone]

## 2016-05-21 ENCOUNTER — Encounter: Payer: Self-pay | Admitting: Emergency Medicine

## 2016-05-21 ENCOUNTER — Emergency Department
Admission: EM | Admit: 2016-05-21 | Discharge: 2016-05-21 | Disposition: A | Discharge status: Home / Self Care | Payer: Medicare HMO | Attending: Student in an Organized Health Care Education/Training Program | Admitting: Student in an Organized Health Care Education/Training Program

## 2016-05-21 DIAGNOSIS — Z79899 Other long term (current) drug therapy: Secondary | ICD-10-CM | POA: Diagnosis not present

## 2016-05-21 DIAGNOSIS — I509 Heart failure, unspecified: Secondary | ICD-10-CM | POA: Insufficient documentation

## 2016-05-21 DIAGNOSIS — M5442 Lumbago with sciatica, left side: Secondary | ICD-10-CM | POA: Diagnosis not present

## 2016-05-21 DIAGNOSIS — Z7901 Long term (current) use of anticoagulants: Secondary | ICD-10-CM | POA: Insufficient documentation

## 2016-05-21 DIAGNOSIS — M5432 Sciatica, left side: Secondary | ICD-10-CM

## 2016-05-21 DIAGNOSIS — Z7982 Long term (current) use of aspirin: Secondary | ICD-10-CM | POA: Diagnosis not present

## 2016-05-21 DIAGNOSIS — M545 Low back pain: Secondary | ICD-10-CM | POA: Diagnosis present

## 2016-05-21 DIAGNOSIS — I110 Hypertensive heart disease with heart failure: Secondary | ICD-10-CM | POA: Diagnosis not present

## 2016-05-21 LAB — URINALYSIS, COMPLETE (UACMP) WITH MICROSCOPIC
BACTERIA UA: NONE SEEN
Bilirubin Urine: NEGATIVE
Glucose, UA: NEGATIVE mg/dL
Ketones, ur: NEGATIVE mg/dL
Leukocytes, UA: NEGATIVE
NITRITE: NEGATIVE
PROTEIN: 30 mg/dL — AB
SPECIFIC GRAVITY, URINE: 1.009 (ref 1.005–1.030)
SQUAMOUS EPITHELIAL / LPF: NONE SEEN
pH: 6 (ref 5.0–8.0)

## 2016-05-21 MED ORDER — PREDNISONE 10 MG (21) PO TBPK: ORAL_TABLET | ORAL | 0 refills | Status: DC

## 2016-05-21 MED ORDER — BACLOFEN 10 MG PO TABS: 10.0000 mg | ORAL_TABLET | Freq: Three times a day (TID) | ORAL | 0 refills | Status: DC

## 2016-05-21 NOTE — ED Triage Notes (Signed)
Pt with bilateral hip pain for one week.

## 2016-05-21 NOTE — Discharge Instructions (Signed)
Follow up with the orthopedic specialist for symptoms that are not improving over the week. Return to the ER for symptoms that change or worsen if unable to schedule an appointment with the specialist or your primary care doctor.

## 2016-05-21 NOTE — ED Provider Notes (Signed)
[facility] Emergency Department Provider Note ____________________________________________  Time seen: Approximately 3:08 PM  I have reviewed the triage vital signs and the nursing notes.   HISTORY  Chief Complaint Hip Pain    HPI Dylan Faulkner is a 76 y.o. male who presents to the emergency department for evaluation of lower back pain with radiation into the left leg. No specific injury. He has not taken any over-the-counter medications for the pain. Occasional bladder incontinence that started several weeks prior to this pain with a history of BPH, otherwise no other potential red flags.   Past Medical History:  Diagnosis Date  . A-fib ([place])   . Enlarged prostate   . Heart failure ([place])   . Hyperlipidemia   . Hypertension   . 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 Vertigo     Patient Active Problem List   Diagnosis Date Noted  . Syncope 05/16/2015    Past Surgical History:  Procedure Laterality Date  . BICEPS TENDON REPAIR    . HERNIA REPAIR    . neck fusion      Prior to Admission medications   Medication Sig Start Date End Date Taking? Authorizing Provider  albuterol (PROVENTIL HFA;VENTOLIN HFA) 108 (90 Base) MCG/ACT inhaler Inhale 2 puffs into the lungs every 6 (six) hours as needed for wheezing or shortness of breath. 03/26/16   Rudene Re, MD  aspirin EC 81 MG tablet Take 81 mg by mouth daily.    Historical Provider, MD  baclofen (LIORESAL) 10 MG tablet Take 1 tablet (10 mg total) by mouth 3 (three) times daily. 05/21/16   Victorino Dike, FNP  cyanocobalamin 500 MCG tablet Take 500 mcg by mouth daily.    Historical Provider, MD  diazepam (VALIUM) 2 MG tablet Take 2 mg by mouth at bedtime.    Historical Provider, MD  finasteride (PROSCAR) 5 MG tablet Take 5 mg by mouth daily.    Historical Provider, MD  fluticasone (FLONASE) 50 MCG/ACT nasal spray Place 2 sprays into both nostrils daily as needed for allergies or rhinitis.     Historical Provider, MD  Fluticasone-Salmeterol (ADVAIR) 250-50 MCG/DOSE AEPB Inhale 1 puff into the lungs 2 (two) times daily.    Historical Provider, MD  folic acid (FOLVITE) 1 MG tablet Take 1 mg by mouth daily.    Historical Provider, MD  furosemide (LASIX) 20 MG tablet Take 1 tablet (20 mg total) by mouth 2 (two) times daily. 03/26/16 03/26/17  Rudene Re, MD  gabapentin (NEURONTIN) 600 MG tablet Take 1 tablet (600 mg total) by mouth 2 (two) times daily. 10/24/15   Lytle Butte, MD  HYDROcodone-acetaminophen (NORCO) 5-325 MG tablet Take 1-2 tablets by mouth every 4 (four) hours as needed for moderate pain. 11/26/15   Pierce Crane Beers, PA-C  isosorbide mononitrate (IMDUR) 30 MG 24 hr tablet Take 1 tablet (30 mg total) by mouth daily. 05/16/15   Bettey Costa, MD  lisinopril (PRINIVIL,ZESTRIL) 10 MG tablet Take 10 mg by mouth daily.    Historical Provider, MD  loratadine (CLARITIN) 10 MG tablet Take 10 mg by mouth daily.    Historical Provider, MD  magnesium oxide (MAG-OX) 400 MG tablet Take 400 mg by mouth daily.    Historical Provider, MD  meclizine (ANTIVERT) 25 MG tablet Take 25 mg by mouth 3 (three) times daily as needed for dizziness.    Historical Provider, MD  montelukast (SINGULAIR) 10 MG tablet Take 10 mg by mouth daily.  Historical Provider, MD  Multiple Vitamin (MULTIVITAMIN WITH MINERALS) TABS tablet Take 1 tablet by mouth daily.    Historical Provider, MD  nitroGLYCERIN (NITROSTAT) 0.4 MG SL tablet Place 0.4 mg under the tongue every 5 (five) minutes as needed for chest pain.    Historical Provider, MD  omeprazole (PRILOSEC) 20 MG capsule Take 20 mg by mouth daily.    Historical Provider, MD  predniSONE (STERAPRED UNI-PAK 21 TAB) 10 MG (21) TBPK tablet Take 6 tablets on day 1 Take 5 tablets on day 2 Take 4 tablets on day 3 Take 3 tablets on day 4 Take 2 tablets on day 5 Take 1 tablet on day 6 05/21/16   Shaine Newmark B [employer]venir Lozinski, FNP  pyridOXINE (VITAMIN B-6) 50 MG tablet Take 50 mg by  mouth daily.    Historical Provider, MD  sertraline (ZOLOFT) 50 MG tablet Take 50 mg by mouth daily.    Historical Provider, MD  simvastatin (ZOCOR) 20 MG tablet Take 20 mg by mouth daily.    Historical Provider, MD  tamsulosin (FLOMAX) 0.4 MG CAPS capsule Take 0.4 mg by mouth daily.     Historical Provider, MD  warfarin (COUMADIN) 5 MG tablet Take 5 mg by mouth daily.     Historical Provider, MD    Allergies Patient has no known allergies.  Family History  Problem Relation Age of Onset  . Hypertension      Social History Social History  Substance Use Topics  . Smoking status: Never Smoker  . Smokeless tobacco: Never Used  . Alcohol use No    Review of Systems Constitutional: No recent illness. Cardiovascular: Denies chest pain or palpitations. Respiratory: Denies shortness of breath. Musculoskeletal: Pain in Lower back with radiation into the left lower extremity. Skin: Negative for rash, wound, lesion. Neurological: Negative for focal weakness or numbness.  ____________________________________________   PHYSICAL EXAM:  VITAL SIGNS: ED Triage Vitals  Enc Vitals Group     BP 05/21/16 1442 (!) 129/97     Pulse Rate 05/21/16 1442 87     Resp 05/21/16 1442 18     Temp 05/21/16 1442 99 F (37.2 C)     Temp Source 05/21/16 1442 Oral     SpO2 05/21/16 1442 97 %     Weight 05/21/16 1443 168 lb (76.2 kg)     Height --      Head Circumference --      Peak Flow --      Pain Score 05/21/16 1443 9     Pain Loc --      Pain Edu? --      Excl. in [place]? --     Constitutional: Alert and oriented. Well appearing and in no acute distress. Eyes: Conjunctivae are normal. EOMI. Head: Atraumatic. Neck: No stridor.  Respiratory: Normal respiratory effort.   Musculoskeletal: Full range of motion throughout, specifically the left hip and left lower extremity. Left straight leg raise is positive at about 30. Ambulation observed without limp.  Neurologic:  Normal speech and  language. No gross focal neurologic deficits are appreciated. Speech is normal. No gait instability. Skin:  Skin is warm, dry and intact. Atraumatic. Psychiatric: Mood and affect are normal. Speech and behavior are normal.  ____________________________________________   LABS (all labs ordered are listed, but only abnormal results are displayed)  Labs Reviewed  URINALYSIS, COMPLETE (UACMP) WITH MICROSCOPIC - Abnormal; Notable for the following:       Result Value   Color, Urine YELLOW (*)  APPearance CLEAR (*)    Hgb urine dipstick SMALL (*)    Protein, ur 30 (*)    All other components within normal limits   ____________________________________________  RADIOLOGY  Not indicated. ____________________________________________   PROCEDURES  Procedure(s) performed: None   ____________________________________________   INITIAL IMPRESSION / ASSESSMENT AND PLAN / ED COURSE   76 year old male presenting to the emergency department for evaluation of low back pain with radiation into the left lower extremity. He will be treated with a tapering dose of prednisone and given baclofen prescriptions. He was instructed to follow-up with orthopedics or his primary care provider for symptoms that are not improving over the next week. He was instructed to return to the emergency department for symptoms change or worsen if unable schedule an appointment.  Pertinent labs & imaging results that were available during my care of the patient were reviewed by me and considered in my medical decision making (see chart for details).  ____________________________________________   FINAL CLINICAL IMPRESSION(S) / ED DIAGNOSES  Final diagnoses:  Sciatica of left side       Victorino Dike, FNP 05/21/16 2147    Merlyn Lot, MD 05/21/16 [phone]

## 2016-06-10 ENCOUNTER — Emergency Department: Payer: Medicare HMO

## 2016-06-10 ENCOUNTER — Encounter: Payer: Self-pay | Admitting: Emergency Medicine

## 2016-06-10 ENCOUNTER — Emergency Department
Admission: EM | Admit: 2016-06-10 | Discharge: 2016-06-10 | Disposition: A | Discharge status: Home / Self Care | Payer: Medicare HMO | Attending: Emergency Medicine | Admitting: Emergency Medicine

## 2016-06-10 DIAGNOSIS — Z79899 Other long term (current) drug therapy: Secondary | ICD-10-CM | POA: Diagnosis not present

## 2016-06-10 DIAGNOSIS — Z7982 Long term (current) use of aspirin: Secondary | ICD-10-CM | POA: Diagnosis not present

## 2016-06-10 DIAGNOSIS — I509 Heart failure, unspecified: Secondary | ICD-10-CM | POA: Insufficient documentation

## 2016-06-10 DIAGNOSIS — J45909 Unspecified asthma, uncomplicated: Secondary | ICD-10-CM | POA: Diagnosis not present

## 2016-06-10 DIAGNOSIS — I110 Hypertensive heart disease with heart failure: Secondary | ICD-10-CM | POA: Diagnosis not present

## 2016-06-10 DIAGNOSIS — R079 Chest pain, unspecified: Secondary | ICD-10-CM | POA: Insufficient documentation

## 2016-06-10 DIAGNOSIS — Z7901 Long term (current) use of anticoagulants: Secondary | ICD-10-CM | POA: Insufficient documentation

## 2016-06-10 HISTORY — DX: Heart failure, unspecified: I50.9

## 2016-06-10 HISTORY — DX: Unspecified asthma, uncomplicated: J45.909

## 2016-06-10 HISTORY — DX: Disorder of kidney and ureter, unspecified: N28.9

## 2016-06-10 LAB — CBC WITH DIFFERENTIAL/PLATELET
Basophils Absolute: 0.1 10*3/uL (ref 0–0.1)
Basophils Relative: 1 %
Eosinophils Absolute: 0.1 10*3/uL (ref 0–0.7)
Eosinophils Relative: 2 %
HEMATOCRIT: 33.6 % — AB (ref 40.0–52.0)
Hemoglobin: 11.5 g/dL — ABNORMAL LOW (ref 13.0–18.0)
LYMPHS ABS: 1.2 10*3/uL (ref 1.0–3.6)
LYMPHS PCT: 13 %
MCH: 33.7 pg (ref 26.0–34.0)
MCHC: 34.2 g/dL (ref 32.0–36.0)
MCV: 98.6 fL (ref 80.0–100.0)
MONO ABS: 1.2 10*3/uL — AB (ref 0.2–1.0)
MONOS PCT: 13 %
NEUTROS ABS: 6.5 10*3/uL (ref 1.4–6.5)
Neutrophils Relative %: 71 %
Platelets: 243 10*3/uL (ref 150–440)
RBC: 3.41 MIL/uL — ABNORMAL LOW (ref 4.40–5.90)
RDW: 14.4 % (ref 11.5–14.5)
WBC: 9 10*3/uL (ref 3.8–10.6)

## 2016-06-10 LAB — BASIC METABOLIC PANEL
Anion gap: 5 (ref 5–15)
BUN: 20 mg/dL (ref 6–20)
CALCIUM: 8.4 mg/dL — AB (ref 8.9–10.3)
CO2: 30 mmol/L (ref 22–32)
CREATININE: 1.53 mg/dL — AB (ref 0.61–1.24)
Chloride: 102 mmol/L (ref 101–111)
GFR calc Af Amer: 50 mL/min — ABNORMAL LOW (ref >60)
GFR calc non Af Amer: 43 mL/min — ABNORMAL LOW (ref >60)
GLUCOSE: 100 mg/dL — AB (ref 65–99)
Potassium: 3.5 mmol/L (ref 3.5–5.1)
Sodium: 137 mmol/L (ref 135–145)

## 2016-06-10 LAB — TROPONIN I
TROPONIN I: 0.03 ng/mL — AB (ref <0.03)
Troponin I: 0.03 ng/mL (ref <0.03)

## 2016-06-10 LAB — PROTIME-INR
INR: 3.99
PROTHROMBIN TIME: 39.9 s — AB (ref 11.4–15.2)

## 2016-06-10 LAB — BRAIN NATRIURETIC PEPTIDE: B Natriuretic Peptide: 562 pg/mL — ABNORMAL HIGH (ref 0.0–100.0)

## 2016-06-10 MED ORDER — DEXTROSE 5 % IV SOLN: 1.0000 g | Freq: Once | INTRAVENOUS | Status: DC

## 2016-06-10 MED ORDER — IOPAMIDOL (ISOVUE-370) INJECTION 76%
60.0000 mL | Freq: Once | INTRAVENOUS | Status: AC | PRN
  Administered 2016-06-10: 60 mL via INTRAVENOUS

## 2016-06-10 MED ORDER — LIDOCAINE 5 % EX PTCH: 1.0000 | MEDICATED_PATCH | CUTANEOUS | 0 refills | Status: DC

## 2016-06-10 MED ORDER — CEFTRIAXONE SODIUM-DEXTROSE 1-3.74 GM-% IV SOLR
INTRAVENOUS | Status: AC
  Administered 2016-06-10: 1000 mg
  Filled 2016-06-10: qty 50

## 2016-06-10 MED ORDER — AZITHROMYCIN 500 MG PO TABS: 500.0000 mg | ORAL_TABLET | Freq: Every day | ORAL | 0 refills | Status: AC

## 2016-06-10 MED ORDER — CEFTRIAXONE SODIUM-DEXTROSE 1-3.74 GM-% IV SOLR: 1.0000 g | Freq: Once | INTRAVENOUS | Status: DC

## 2016-06-10 NOTE — ED Provider Notes (Signed)
[facility] Emergency Department Provider Note  ____________________________________________   First MD Initiated Contact with Patient 06/10/16 1235     (approximate)  I have reviewed the triage vital signs and the nursing notes.   HISTORY  Chief Complaint Chest Pain   HPI Dylan Faulkner is a 76 y.o. male with a history of atrial fibrillation as well as heart failure who is presenting with "a funny feeling in his chest." He says this started about an hour ago. He was sitting watching television when it happened. Denies any strange feeling in his chest at this time her pain. EMS was called and when they arrived he was 90% on room air. The patient does not wear home oxygen. They then placed him on nasal cannula oxygen with resolution of his saturations up to 99%. That and took it off again and he dropped quickly down to 90%. The patient is denying any shortness of breath at this time. Is maintaining on his nasal cannula oxygen now as well.   Past Medical History:  Diagnosis Date  . A-fib ([place])   . Asthma   . CHF (congestive heart failure) ([place])   . Enlarged prostate   . Heart failure ([place])   . Hyperlipidemia   . Hypertension   . 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 Renal insufficiency   . Vertigo     Patient Active Problem List   Diagnosis Date Noted  . Syncope 05/16/2015    Past Surgical History:  Procedure Laterality Date  . BICEPS TENDON REPAIR    . HERNIA REPAIR    . neck fusion      Prior to Admission medications   Medication Sig Start Date End Date Taking? Authorizing Provider  albuterol (PROVENTIL HFA;VENTOLIN HFA) 108 (90 Base) MCG/ACT inhaler Inhale 2 puffs into the lungs every 6 (six) hours as needed for wheezing or shortness of breath. 03/26/16  Yes Rudene Re, MD  cyanocobalamin 500 MCG tablet Take 500 mcg by mouth daily.   Yes Historical Provider, MD  cyclobenzaprine (FLEXERIL) 10 MG tablet Take 10 mg by mouth 2 (two)  times daily.   Yes Historical Provider, MD  diazepam (VALIUM) 2 MG tablet Take 2 mg by mouth at bedtime.   Yes Historical Provider, MD  finasteride (PROSCAR) 5 MG tablet Take 5 mg by mouth daily.   Yes Historical Provider, MD  fluticasone (FLONASE) 50 MCG/ACT nasal spray Place 2 sprays into both nostrils daily as needed for allergies or rhinitis.   Yes Historical Provider, MD  Fluticasone-Salmeterol (ADVAIR) 250-50 MCG/DOSE AEPB Inhale 1 puff into the lungs 2 (two) times daily.   Yes Historical Provider, MD  folic acid (FOLVITE) 1 MG tablet Take 1 mg by mouth daily.   Yes Historical Provider, MD  furosemide (LASIX) 20 MG tablet Take 1 tablet (20 mg total) by mouth 2 (two) times daily. 03/26/16 03/26/17 Yes Rudene Re, MD  gabapentin (NEURONTIN) 600 MG tablet Take 1 tablet (600 mg total) by mouth 2 (two) times daily. 10/24/15  Yes Lytle Butte, MD  HYDROcodone-acetaminophen (NORCO) 5-325 MG tablet Take 1-2 tablets by mouth every 4 (four) hours as needed for moderate pain. 11/26/15  Yes Pierce Crane Beers, PA-C  isosorbide mononitrate (IMDUR) 30 MG 24 hr tablet Take 1 tablet (30 mg total) by mouth daily. 05/16/15  Yes Sital Mody, MD  lisinopril (PRINIVIL,ZESTRIL) 10 MG tablet Take 10 mg by mouth daily.   Yes Historical Provider, MD  loratadine (CLARITIN)  10 MG tablet Take 10 mg by mouth daily.   Yes Historical Provider, MD  magnesium oxide (MAG-OX) 400 MG tablet Take 400 mg by mouth daily.   Yes Historical Provider, MD  meclizine (ANTIVERT) 25 MG tablet Take 25 mg by mouth 3 (three) times daily as needed for dizziness.   Yes Historical Provider, MD  montelukast (SINGULAIR) 10 MG tablet Take 10 mg by mouth daily.   Yes Historical Provider, MD  Multiple Vitamin (MULTIVITAMIN WITH MINERALS) TABS tablet Take 1 tablet by mouth daily.   Yes Historical Provider, MD  nitroGLYCERIN (NITROSTAT) 0.4 MG SL tablet Place 0.4 mg under the tongue every 5 (five) minutes as needed for chest pain.   Yes Historical  Provider, MD  omeprazole (PRILOSEC) 20 MG capsule Take 20 mg by mouth daily.   Yes Historical Provider, MD  pyridOXINE (VITAMIN B-6) 50 MG tablet Take 50 mg by mouth daily.   Yes Historical Provider, MD  sertraline (ZOLOFT) 50 MG tablet Take 50 mg by mouth daily.   Yes Historical Provider, MD  simvastatin (ZOCOR) 20 MG tablet Take 20 mg by mouth daily.   Yes Historical Provider, MD  tamsulosin (FLOMAX) 0.4 MG CAPS capsule Take 0.4 mg by mouth daily.    Yes Historical Provider, MD  warfarin (COUMADIN) 5 MG tablet Take 2.5-5 mg by mouth daily. Take 1 tab Monday-Thursday and 0.5 tab Friday-Sunday   Yes Historical Provider, MD  aspirin EC 81 MG tablet Take 81 mg by mouth daily.    Historical Provider, MD  baclofen (LIORESAL) 10 MG tablet Take 1 tablet (10 mg total) by mouth 3 (three) times daily. Patient not taking: Reported on 06/10/2016 05/21/16   Victorino Dike, FNP  predniSONE (STERAPRED UNI-PAK 21 TAB) 10 MG (21) TBPK tablet Take 6 tablets on day 1 Take 5 tablets on day 2 Take 4 tablets on day 3 Take 3 tablets on day 4 Take 2 tablets on day 5 Take 1 tablet on day 6 Patient not taking: Reported on 06/10/2016 05/21/16   Victorino Dike, FNP    Allergies Patient has no known allergies.  Family History  Problem Relation Age of Onset  . Hypertension      Social History Social History  Substance Use Topics  . Smoking status: Never Smoker  . Smokeless tobacco: Never Used  . Alcohol use No    Review of Systems Constitutional: No fever/chills Eyes: No visual changes. ENT: No sore throat. Cardiovascular:asa bove Respiratory: Denies shortness of breath. Gastrointestinal: No abdominal pain.  No nausea, no vomiting.  No diarrhea.  No constipation. Genitourinary: Negative for dysuria. Musculoskeletal: Negative for back pain. Skin: Negative for rash. Neurological: Negative for headaches, focal weakness or numbness.  10-point ROS otherwise  negative.  ____________________________________________   PHYSICAL EXAM:  VITAL SIGNS: ED Triage Vitals  Enc Vitals Group     BP 06/10/16 1239 (!) 135/97     Pulse Rate 06/10/16 1239 86     Resp 06/10/16 1239 18     Temp 06/10/16 1253 98.6 F (37 C)     Temp Source 06/10/16 1253 Oral     SpO2 06/10/16 1239 96 %     Weight 06/10/16 1239 160 lb (72.6 kg)     Height 06/10/16 1239 5\' 10"  (1.778 m)     Head Circumference --      Peak Flow --      Pain Score 06/10/16 1240 8     Pain Loc --  Pain Edu? --      Excl. in [place]? --     Constitutional: Alert and oriented. Well appearing and in no acute distress. Eyes: Conjunctivae are normal. PERRL. EOMI. Head: Atraumatic. Nose: No congestion/rhinnorhea. Mouth/Throat: Mucous membranes are moist.   Neck: No stridor.   Cardiovascular: Normal rate but with an irregularly irregular rhythm. Grossly normal heart sounds.   Respiratory: Normal respiratory effort.  No retractions. Lungs CTAB. Gastrointestinal: Soft and nontender. No distention.  Musculoskeletal: No lower extremity tenderness nor edema.  No joint effusions. Neurologic:  Normal speech and language. No gross focal neurologic deficits are appreciated. \ Skin:  Skin is warm, dry and intact. No rash noted. Psychiatric: Mood and affect are normal. Speech and behavior are normal.  ____________________________________________   LABS (all labs ordered are listed, but only abnormal results are displayed)  Labs Reviewed  CBC WITH DIFFERENTIAL/PLATELET - Abnormal; Notable for the following:       Result Value   RBC 3.41 (*)    Hemoglobin 11.5 (*)    HCT 33.6 (*)    Monocytes Absolute 1.2 (*)    All other components within normal limits  BASIC METABOLIC PANEL - Abnormal; Notable for the following:    Glucose, Bld 100 (*)    Creatinine, Ser 1.53 (*)    Calcium 8.4 (*)    GFR calc non Af Amer 43 (*)    GFR calc Af Amer 50 (*)    All other components within normal limits   TROPONIN I - Abnormal; Notable for the following:    Troponin I 0.03 (*)    All other components within normal limits  BRAIN NATRIURETIC PEPTIDE - Abnormal; Notable for the following:    B Natriuretic Peptide 562.0 (*)    All other components within normal limits  PROTIME-INR - Abnormal; Notable for the following:    Prothrombin Time 39.9 (*)    All other components within normal limits  TROPONIN I   ____________________________________________  EKG  ED ECG REPORT I, Doran Stabler, the attending physician, personally viewed and interpreted this ECG.   Date: 06/10/2016  EKG Time: 1242  Rate: 85  Rhythm: atrial fibrillation, rate 85  Axis: normal  Intervals:none  ST&T Change: No ST elevation or depression. No abnormal T-wave inversion.  ____________________________________________  [id number]  DG Chest 1 View (Final result)  Result time 06/10/16 13:06:24  Final result by David A Martinique, MD (06/10/16 13:06:24)           Narrative:   CLINICAL DATA: Chest pain. History of CHF, atrial fibrillation, hypertension, remote history of leukemia.  EXAM: CHEST 1 VIEW  COMPARISON: PA and lateral chest x-ray of March 26, 2016  FINDINGS: The right lung is adequately inflated and clear. On the left there is increased linear density in the mid lung new since August 2017 and more conspicuous since the December study. The cardiac silhouette is top-normal in size. The pulmonary vascularity is normal. There is calcification in the wall of the aortic arch. The trachea is midline. There is no pleural effusion. The bony thorax exhibits no acute abnormality.  IMPRESSION: Mild cardiomegaly. No pulmonary vascular congestion or pulmonary edema. Increased density in the left mid lung suggests atelectasis or possible early pneumonia. Followup PA and lateral chest X-ray is recommended in 3-4 weeks following trial of antibiotic therapy to ensure resolution and exclude underlying  malignancy. If the patient's clinical and laboratory findings do not support possible pneumonia, chest CT scanning now would be a  useful next imaging step.  Thoracic aortic atherosclerosis.   Electronically Signed By: David Martinique M.D. On: 06/10/2016 13:06          ____________________________________________   PROCEDURES  Procedure(s) performed:   Procedures  Critical Care performed:   ____________________________________________   INITIAL IMPRESSION / ASSESSMENT AND PLAN / ED COURSE  Pertinent labs & imaging results that were available during my care of the patient were reviewed by me and considered in my medical decision making (see chart for details).   ----------------------------------------- 3 PM on 06/10/2016 ----------------------------------------- Patient without any distress. On room oxygen at 94-95%. Wife is now the bedside and says that the patient has had night sweats several months this week. Also with a cough but she says this is chronic and unchanged. Patient with possible pneumonia on chest x-ray. Pending CT of the chest at this time. CT was ordered as an angiography because of the patient's sudden onset chest pain one hour prior to arrival. Signed out to Dr. Marcelene Butte.      ____________________________________________   FINAL CLINICAL IMPRESSION(S) / ED DIAGNOSES  Chest pain.    NEW MEDICATIONS STARTED DURING THIS VISIT:  New Prescriptions   No medications on file     Note:  This document was prepared using Dragon voice recognition software and may include unintentional dictation errors.    Orbie Pyo, MD 06/10/16 [phone]

## 2016-06-10 NOTE — Discharge Instructions (Signed)
Please return the emergency Department for any shortness of breath, high fever, unable to tolerate oral antibiotic, or any other new concerns. Most prescription antibiotics can interact with anticoagulants and please have your levels rechecked Please return immediately if condition worsens. Please contact her primary physician or the physician you were given for referral. If you have any specialist physicians involved in her treatment and plan please also contact them. Thank you for using [place] regional emergency Department.

## 2016-06-10 NOTE — Patient Instructions (Addendum)
Follow up with the [facility] 1:00pm on 06/14/2016

## 2016-06-10 NOTE — ED Notes (Signed)
REDS VEST READING= 32% CHEST RULER=16.5  VEST FITTING TASKS: POSTURE=normal HEIGHT MARKER=t CENTER STRIP=aligned  COMMENTS:approved by [employer]

## 2016-06-10 NOTE — ED Triage Notes (Signed)
Pt arrived via EMS from home for reports of CP. EMS reports 136/93. 91% RA, 100% 2L.

## 2016-06-14 ENCOUNTER — Ambulatory Visit: Payer: Medicare HMO | Admitting: Family

## 2016-07-20 ENCOUNTER — Inpatient Hospital Stay
Admission: EM | Admit: 2016-07-20 | Discharge: 2016-07-22 | DRG: 291 | Disposition: A | Discharge status: Home / Self Care | Payer: Medicare HMO | Attending: Internal Medicine | Admitting: Internal Medicine

## 2016-07-20 ENCOUNTER — Emergency Department: Payer: Medicare HMO

## 2016-07-20 ENCOUNTER — Encounter: Payer: Self-pay | Admitting: *Deleted

## 2016-07-20 DIAGNOSIS — I509 Heart failure, unspecified: Secondary | ICD-10-CM

## 2016-07-20 DIAGNOSIS — Z7901 Long term (current) use of anticoagulants: Secondary | ICD-10-CM

## 2016-07-20 DIAGNOSIS — Z7983 Long term (current) use of bisphosphonates: Secondary | ICD-10-CM | POA: Diagnosis not present

## 2016-07-20 DIAGNOSIS — Z801 Family history of malignant neoplasm of trachea, bronchus and lung: Secondary | ICD-10-CM

## 2016-07-20 DIAGNOSIS — R42 Dizziness and giddiness: Secondary | ICD-10-CM | POA: Diagnosis present

## 2016-07-20 DIAGNOSIS — Z856 Personal history of leukemia: Secondary | ICD-10-CM

## 2016-07-20 DIAGNOSIS — I429 Cardiomyopathy, unspecified: Secondary | ICD-10-CM | POA: Diagnosis present

## 2016-07-20 DIAGNOSIS — N400 Enlarged prostate without lower urinary tract symptoms: Secondary | ICD-10-CM | POA: Diagnosis present

## 2016-07-20 DIAGNOSIS — N183 Chronic kidney disease, stage 3 (moderate): Secondary | ICD-10-CM | POA: Diagnosis present

## 2016-07-20 DIAGNOSIS — Z79899 Other long term (current) drug therapy: Secondary | ICD-10-CM | POA: Diagnosis not present

## 2016-07-20 DIAGNOSIS — I130 Hypertensive heart and chronic kidney disease with heart failure and stage 1 through stage 4 chronic kidney disease, or unspecified chronic kidney disease: Principal | ICD-10-CM | POA: Diagnosis present

## 2016-07-20 DIAGNOSIS — J449 Chronic obstructive pulmonary disease, unspecified: Secondary | ICD-10-CM | POA: Diagnosis present

## 2016-07-20 DIAGNOSIS — E785 Hyperlipidemia, unspecified: Secondary | ICD-10-CM | POA: Diagnosis present

## 2016-07-20 DIAGNOSIS — I482 Chronic atrial fibrillation: Secondary | ICD-10-CM | POA: Diagnosis present

## 2016-07-20 DIAGNOSIS — Z8249 Family history of ischemic heart disease and other diseases of the circulatory system: Secondary | ICD-10-CM

## 2016-07-20 DIAGNOSIS — I5033 Acute on chronic diastolic (congestive) heart failure: Secondary | ICD-10-CM | POA: Diagnosis present

## 2016-07-20 DIAGNOSIS — J9601 Acute respiratory failure with hypoxia: Secondary | ICD-10-CM | POA: Diagnosis present

## 2016-07-20 DIAGNOSIS — F329 Major depressive disorder, single episode, unspecified: Secondary | ICD-10-CM | POA: Diagnosis present

## 2016-07-20 DIAGNOSIS — Z981 Arthrodesis status: Secondary | ICD-10-CM

## 2016-07-20 DIAGNOSIS — Z841 Family history of disorders of kidney and ureter: Secondary | ICD-10-CM | POA: Diagnosis not present

## 2016-07-20 DIAGNOSIS — H6690 Otitis media, unspecified, unspecified ear: Secondary | ICD-10-CM | POA: Diagnosis present

## 2016-07-20 DIAGNOSIS — I5021 Acute systolic (congestive) heart failure: Secondary | ICD-10-CM

## 2016-07-20 HISTORY — DX: Unspecified atrial fibrillation: I48.91

## 2016-07-20 HISTORY — DX: Benign prostatic hyperplasia without lower urinary tract symptoms: N40.0

## 2016-07-20 LAB — CBC WITH DIFFERENTIAL/PLATELET
BASOS ABS: 0 10*3/uL (ref 0–0.1)
BASOS PCT: 0 %
EOS ABS: 0.1 10*3/uL (ref 0–0.7)
Eosinophils Relative: 1 %
HEMATOCRIT: 32.3 % — AB (ref 40.0–52.0)
Hemoglobin: 10.9 g/dL — ABNORMAL LOW (ref 13.0–18.0)
Lymphocytes Relative: 12 %
Lymphs Abs: 1.4 10*3/uL (ref 1.0–3.6)
MCH: 33.4 pg (ref 26.0–34.0)
MCHC: 33.6 g/dL (ref 32.0–36.0)
MCV: 99.2 fL (ref 80.0–100.0)
MONO ABS: 1.4 10*3/uL — AB (ref 0.2–1.0)
Monocytes Relative: 11 %
Neutro Abs: 9.1 10*3/uL — ABNORMAL HIGH (ref 1.4–6.5)
Neutrophils Relative %: 76 %
PLATELETS: 211 10*3/uL (ref 150–440)
RBC: 3.26 MIL/uL — ABNORMAL LOW (ref 4.40–5.90)
RDW: 15.7 % — AB (ref 11.5–14.5)
WBC: 12 10*3/uL — ABNORMAL HIGH (ref 3.8–10.6)

## 2016-07-20 LAB — BASIC METABOLIC PANEL
ANION GAP: 6 (ref 5–15)
BUN: 24 mg/dL — ABNORMAL HIGH (ref 6–20)
CO2: 27 mmol/L (ref 22–32)
Calcium: 8.6 mg/dL — ABNORMAL LOW (ref 8.9–10.3)
Chloride: 104 mmol/L (ref 101–111)
Creatinine, Ser: 1.36 mg/dL — ABNORMAL HIGH (ref 0.61–1.24)
GFR, EST AFRICAN AMERICAN: 58 mL/min — AB (ref >60)
GFR, EST NON AFRICAN AMERICAN: 50 mL/min — AB (ref >60)
Glucose, Bld: 99 mg/dL (ref 65–99)
Potassium: 3.7 mmol/L (ref 3.5–5.1)
SODIUM: 137 mmol/L (ref 135–145)

## 2016-07-20 LAB — TROPONIN I: TROPONIN I: 0.03 ng/mL — AB (ref <0.03)

## 2016-07-20 LAB — BRAIN NATRIURETIC PEPTIDE: B NATRIURETIC PEPTIDE 5: 657 pg/mL — AB (ref 0.0–100.0)

## 2016-07-20 LAB — PROTIME-INR
INR: 2.55
PROTHROMBIN TIME: 27.9 s — AB (ref 11.4–15.2)

## 2016-07-20 MED ORDER — WARFARIN SODIUM 2.5 MG PO TABS: 2.5000 mg | ORAL_TABLET | ORAL | Status: DC

## 2016-07-20 MED ORDER — ISOSORBIDE MONONITRATE ER 30 MG PO TB24
30.0000 mg | ORAL_TABLET | Freq: Every day | ORAL | Status: DC
  Administered 2016-07-21 – 2016-07-22 (×2): 30 mg via ORAL
  Filled 2016-07-20 (×2): qty 1

## 2016-07-20 MED ORDER — SERTRALINE HCL 50 MG PO TABS
50.0000 mg | ORAL_TABLET | Freq: Every day | ORAL | Status: DC
  Administered 2016-07-21 – 2016-07-22 (×2): 50 mg via ORAL
  Filled 2016-07-20 (×2): qty 1

## 2016-07-20 MED ORDER — TAMSULOSIN HCL 0.4 MG PO CAPS
0.4000 mg | ORAL_CAPSULE | Freq: Every day | ORAL | Status: DC
  Administered 2016-07-21 – 2016-07-22 (×2): 0.4 mg via ORAL
  Filled 2016-07-20 (×2): qty 1

## 2016-07-20 MED ORDER — VITAMIN B-6 50 MG PO TABS
50.0000 mg | ORAL_TABLET | Freq: Every day | ORAL | Status: DC
  Administered 2016-07-21 – 2016-07-22 (×2): 50 mg via ORAL
  Filled 2016-07-20 (×2): qty 1

## 2016-07-20 MED ORDER — IPRATROPIUM-ALBUTEROL 0.5-2.5 (3) MG/3ML IN SOLN
3.0000 mL | Freq: Four times a day (QID) | RESPIRATORY_TRACT | Status: DC
  Administered 2016-07-21 (×2): 3 mL via RESPIRATORY_TRACT
  Filled 2016-07-20 (×2): qty 3

## 2016-07-20 MED ORDER — MOMETASONE FURO-FORMOTEROL FUM 200-5 MCG/ACT IN AERO
2.0000 | INHALATION_SPRAY | Freq: Two times a day (BID) | RESPIRATORY_TRACT | Status: DC
  Administered 2016-07-20 – 2016-07-22 (×4): 2 via RESPIRATORY_TRACT
  Filled 2016-07-20: qty 8.8

## 2016-07-20 MED ORDER — LORATADINE 10 MG PO TABS
10.0000 mg | ORAL_TABLET | Freq: Every day | ORAL | Status: DC
  Administered 2016-07-21 – 2016-07-22 (×2): 10 mg via ORAL
  Filled 2016-07-20 (×2): qty 1

## 2016-07-20 MED ORDER — LISINOPRIL 20 MG PO TABS
20.0000 mg | ORAL_TABLET | Freq: Every day | ORAL | Status: DC
  Administered 2016-07-21 – 2016-07-22 (×2): 20 mg via ORAL
  Filled 2016-07-20 (×2): qty 1

## 2016-07-20 MED ORDER — PANTOPRAZOLE SODIUM 40 MG PO TBEC
40.0000 mg | DELAYED_RELEASE_TABLET | Freq: Every day | ORAL | Status: DC
  Administered 2016-07-21 – 2016-07-22 (×2): 40 mg via ORAL
  Filled 2016-07-20 (×2): qty 1

## 2016-07-20 MED ORDER — NITROGLYCERIN 0.4 MG SL SUBL: 0.4000 mg | SUBLINGUAL_TABLET | SUBLINGUAL | Status: DC | PRN

## 2016-07-20 MED ORDER — FINASTERIDE 5 MG PO TABS
5.0000 mg | ORAL_TABLET | Freq: Every day | ORAL | Status: DC
  Administered 2016-07-21 – 2016-07-22 (×2): 5 mg via ORAL
  Filled 2016-07-20 (×2): qty 1

## 2016-07-20 MED ORDER — FOLIC ACID 1 MG PO TABS
1.0000 mg | ORAL_TABLET | Freq: Every day | ORAL | Status: DC
  Administered 2016-07-21 – 2016-07-22 (×2): 1 mg via ORAL
  Filled 2016-07-20 (×2): qty 1

## 2016-07-20 MED ORDER — POTASSIUM CHLORIDE ER 20 MEQ PO TBCR: 20.0000 meq | EXTENDED_RELEASE_TABLET | Freq: Every day | ORAL | Status: DC

## 2016-07-20 MED ORDER — HYDROCODONE-ACETAMINOPHEN 10-325 MG PO TABS: 1.0000 | ORAL_TABLET | Freq: Three times a day (TID) | ORAL | Status: DC | PRN

## 2016-07-20 MED ORDER — MECLIZINE HCL 25 MG PO TABS: 25.0000 mg | ORAL_TABLET | Freq: Three times a day (TID) | ORAL | Status: DC | PRN

## 2016-07-20 MED ORDER — ADULT MULTIVITAMIN W/MINERALS CH
1.0000 | ORAL_TABLET | Freq: Every day | ORAL | Status: DC
  Administered 2016-07-21 – 2016-07-22 (×2): 1 via ORAL
  Filled 2016-07-20 (×2): qty 1

## 2016-07-20 MED ORDER — ACETAMINOPHEN 325 MG PO TABS: 650.0000 mg | ORAL_TABLET | Freq: Four times a day (QID) | ORAL | Status: DC | PRN

## 2016-07-20 MED ORDER — MONTELUKAST SODIUM 10 MG PO TABS
10.0000 mg | ORAL_TABLET | Freq: Every day | ORAL | Status: DC
  Administered 2016-07-21 – 2016-07-22 (×2): 10 mg via ORAL
  Filled 2016-07-20 (×2): qty 1

## 2016-07-20 MED ORDER — SIMVASTATIN 20 MG PO TABS
20.0000 mg | ORAL_TABLET | Freq: Every day | ORAL | Status: DC
  Administered 2016-07-21: 20 mg via ORAL
  Filled 2016-07-20: qty 1

## 2016-07-20 MED ORDER — FLUTICASONE PROPIONATE 50 MCG/ACT NA SUSP
2.0000 | Freq: Every day | NASAL | Status: DC | PRN
  Filled 2016-07-20: qty 16

## 2016-07-20 MED ORDER — GABAPENTIN 600 MG PO TABS
600.0000 mg | ORAL_TABLET | Freq: Three times a day (TID) | ORAL | Status: DC
  Administered 2016-07-20 – 2016-07-22 (×5): 600 mg via ORAL
  Filled 2016-07-20 (×5): qty 1

## 2016-07-20 MED ORDER — VITAMIN B-12 1000 MCG PO TABS
500.0000 ug | ORAL_TABLET | Freq: Every day | ORAL | Status: DC
  Administered 2016-07-21 – 2016-07-22 (×2): 500 ug via ORAL
  Filled 2016-07-20 (×2): qty 1

## 2016-07-20 MED ORDER — FUROSEMIDE 10 MG/ML IJ SOLN
80.0000 mg | Freq: Once | INTRAMUSCULAR | Status: AC
  Administered 2016-07-20: 80 mg via INTRAVENOUS
  Filled 2016-07-20: qty 8

## 2016-07-20 MED ORDER — ONDANSETRON HCL 4 MG/2ML IJ SOLN: 4.0000 mg | Freq: Four times a day (QID) | INTRAMUSCULAR | Status: DC | PRN

## 2016-07-20 MED ORDER — CYCLOBENZAPRINE HCL 10 MG PO TABS: 10.0000 mg | ORAL_TABLET | Freq: Two times a day (BID) | ORAL | Status: DC | PRN

## 2016-07-20 MED ORDER — AMOXICILLIN-POT CLAVULANATE 875-125 MG PO TABS: 1.0000 | ORAL_TABLET | Freq: Two times a day (BID) | ORAL | Status: DC

## 2016-07-20 MED ORDER — ALBUTEROL SULFATE (2.5 MG/3ML) 0.083% IN NEBU: 3.0000 mL | INHALATION_SOLUTION | Freq: Four times a day (QID) | RESPIRATORY_TRACT | Status: DC | PRN

## 2016-07-20 MED ORDER — ONDANSETRON HCL 4 MG PO TABS: 4.0000 mg | ORAL_TABLET | Freq: Four times a day (QID) | ORAL | Status: DC | PRN

## 2016-07-20 MED ORDER — METOPROLOL TARTRATE 25 MG PO TABS
25.0000 mg | ORAL_TABLET | Freq: Two times a day (BID) | ORAL | Status: DC
  Administered 2016-07-20 – 2016-07-21 (×2): 25 mg via ORAL
  Filled 2016-07-20 (×2): qty 1

## 2016-07-20 MED ORDER — WARFARIN SODIUM 5 MG PO TABS
5.0000 mg | ORAL_TABLET | ORAL | Status: DC
  Administered 2016-07-21: 5 mg via ORAL
  Filled 2016-07-20: qty 1

## 2016-07-20 MED ORDER — AMOXICILLIN-POT CLAVULANATE 500-125 MG PO TABS
1.0000 | ORAL_TABLET | Freq: Two times a day (BID) | ORAL | Status: DC
  Administered 2016-07-20 – 2016-07-22 (×4): 500 mg via ORAL
  Filled 2016-07-20 (×4): qty 1

## 2016-07-20 MED ORDER — WARFARIN - PHARMACIST DOSING INPATIENT
Freq: Every day | Status: DC
  Administered 2016-07-21: 18:00:00
  Filled 2016-07-20: qty 1

## 2016-07-20 MED ORDER — ACETAMINOPHEN 650 MG RE SUPP: 650.0000 mg | Freq: Four times a day (QID) | RECTAL | Status: DC | PRN

## 2016-07-20 MED ORDER — POTASSIUM CHLORIDE CRYS ER 20 MEQ PO TBCR
20.0000 meq | EXTENDED_RELEASE_TABLET | Freq: Every day | ORAL | Status: DC
  Administered 2016-07-21 – 2016-07-22 (×2): 20 meq via ORAL
  Filled 2016-07-20 (×2): qty 1

## 2016-07-20 MED ORDER — FUROSEMIDE 10 MG/ML IJ SOLN
80.0000 mg | Freq: Two times a day (BID) | INTRAMUSCULAR | Status: DC
  Administered 2016-07-21: 80 mg via INTRAVENOUS
  Filled 2016-07-20: qty 8

## 2016-07-20 NOTE — Progress Notes (Signed)
ANTICOAGULATION CONSULT NOTE - Initial Consult  Pharmacy Consult for warfarin dosing and monitoring  Indication: atrial fibrillation  No Known Allergies  Patient Measurements: Height: 5\' 10"  (177.8 cm) Weight: 204 lb 2.3 oz (92.6 kg) IBW/kg (Calculated) : 73  Vital Signs: Temp: 99.1 F (37.3 C) (04/17 1700) Temp Source: Oral (04/17 1700) BP: 127/88 (04/17 2101) Pulse Rate: 97 (04/17 2101)  Labs:  Recent Labs  07/20/16 1721  HGB 10.9*  HCT 32.3*  PLT 211  LABPROT 27.9*  INR 2.55  CREATININE 1.36*  TROPONINI 0.03*    Estimated Creatinine Clearance: 54.5 mL/min (A) (by C-G formula based on SCr of 1.36 mg/dL (H)).   Medical History: Past Medical History:  Diagnosis Date  . A-fib ([place])   . Asthma   . Atrial fibrillation ([place])   . BPH (benign prostatic hyperplasia)   . CHF (congestive heart failure) ([place])   . Enlarged prostate   . Heart failure ([place])   . Hyperlipidemia   . Hypertension   . 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 Renal insufficiency   . Vertigo     Assessment: 76 yo male PMH of A. Fib. Pharmacy consulted for warfarin dosing and monitoring. Patient takes warfarin outpatient and was admitted with therapeutic INR. Patient is receiving Augmentin.   Home Regimen: Warfarin 5mg : Mon, Tues, Wed, Thurs                             Warfarin 2.5mg  Fri, Sat, Sun   DATE  INR  DOSE 4/17  2.55  Warfarin 5mg  (At home)   Goal of Therapy:  INR 2-3 Monitor platelets by anticoagulation protocol: Yes   Plan:  Patient reports taking last dose this morning. Will Resume patients home regimen tomorrow of warfarin 5mg  Mon-Thurs, and warfarin 2.5mg  Fri-sun. Will monitor INR daily while on antibiotics per [facility] protocol.   Pernell Dupre, PharmD, BCPS Clinical Pharmacist 07/20/2016 9:30 PM

## 2016-07-20 NOTE — ED Provider Notes (Signed)
[facility] Emergency Department Provider Note  ____________________________________________  Time seen: Approximately 6:25 PM  I have reviewed the triage vital signs and the nursing notes.   HISTORY  Chief Complaint Respiratory Distress   HPI Dylan Faulkner is a 76 y.o. male with a history of atrial fibrillation on Coumadin, CHF, hypertension, hyperlipidemia who presents for evaluation of severe shortness of breath. Patient reports that his been getting progressively worsening shortness of breath for the last 5 days. Has had increased swelling of both of his extremities. According to his wife patient's Lasix was decreased from 160 mg daily to 80 mg daily a month ago by his PCP. Patient reports that his shortness of breath is worsened with minimal exertion or laying down. He's been sleeping on 2 pillows which is his baseline. Patient does not use oxygen at baseline. He does not smoke. He denies chest pain, cough, fever, chills, abdominal pain, nausea, vomiting, diarrhea.  Past Medical History:  Diagnosis Date  . A-fib ([place])   . Asthma   . CHF (congestive heart failure) ([place])   . Enlarged prostate   . Heart failure ([place])   . Hyperlipidemia   . Hypertension   . 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 Renal insufficiency   . Vertigo     Patient Active Problem List   Diagnosis Date Noted  . Syncope 05/16/2015    Past Surgical History:  Procedure Laterality Date  . BICEPS TENDON REPAIR    . HERNIA REPAIR    . neck fusion      Prior to Admission medications   Medication Sig Start Date End Date Taking? Authorizing Provider  albuterol (PROVENTIL HFA;VENTOLIN HFA) 108 (90 Base) MCG/ACT inhaler Inhale 2 puffs into the lungs every 6 (six) hours as needed for wheezing or shortness of breath. 03/26/16  Yes Rudene Re, MD  fluticasone [facility]) 50 MCG/ACT nasal spray Place 2 sprays into both nostrils daily as needed for allergies or rhinitis.   Yes  Historical Provider, MD  nitroGLYCERIN (NITROSTAT) 0.4 MG SL tablet Place 0.4 mg under the tongue every 5 (five) minutes as needed for chest pain.   Yes Historical Provider, MD  baclofen (LIORESAL) 10 MG tablet Take 1 tablet (10 mg total) by mouth 3 (three) times daily. Patient not taking: Reported on 06/10/2016 05/21/16   Victorino Dike, FNP  cyanocobalamin 500 MCG tablet Take 500 mcg by mouth daily.    Historical Provider, MD  cyclobenzaprine (FLEXERIL) 10 MG tablet Take 10 mg by mouth 2 (two) times daily.    Historical Provider, MD  diazepam (VALIUM) 2 MG tablet Take 2 mg by mouth at bedtime.    Historical Provider, MD  finasteride (PROSCAR) 5 MG tablet Take 5 mg by mouth daily.    Historical Provider, MD  Fluticasone-Salmeterol (ADVAIR) 250-50 MCG/DOSE AEPB Inhale 1 puff into the lungs 2 (two) times daily.    Historical Provider, MD  folic acid (FOLVITE) 1 MG tablet Take 1 mg by mouth daily.    Historical Provider, MD  furosemide (LASIX) 20 MG tablet Take 1 tablet (20 mg total) by mouth 2 (two) times daily. 03/26/16 03/26/17  Rudene Re, MD  gabapentin (NEURONTIN) 600 MG tablet Take 1 tablet (600 mg total) by mouth 2 (two) times daily. 10/24/15   Lytle Butte, MD  HYDROcodone-acetaminophen (NORCO) 5-325 MG tablet Take 1-2 tablets by mouth every 4 (four) hours as needed for moderate pain. 11/26/15   Arlyss Repress, PA-C  isosorbide mononitrate (IMDUR) 30 MG 24 hr tablet Take 1 tablet (30 mg total) by mouth daily. 05/16/15   Sital Mody, MD  lidocaine (LIDODERM) 5 % Place 1 patch onto the skin daily. 06/10/16   Daymon Larsen, MD  lisinopril (PRINIVIL,ZESTRIL) 10 MG tablet Take 10 mg by mouth daily.    Historical Provider, MD  loratadine (CLARITIN) 10 MG tablet Take 10 mg by mouth daily.    Historical Provider, MD  magnesium oxide (MAG-OX) 400 MG tablet Take 400 mg by mouth daily.    Historical Provider, MD  meclizine (ANTIVERT) 25 MG tablet Take 25 mg by mouth 3 (three) times daily as needed  for dizziness.    Historical Provider, MD  montelukast (SINGULAIR) 10 MG tablet Take 10 mg by mouth daily.    Historical Provider, MD  Multiple Vitamin (MULTIVITAMIN WITH MINERALS) TABS tablet Take 1 tablet by mouth daily.    Historical Provider, MD  omeprazole (PRILOSEC) 20 MG capsule Take 20 mg by mouth daily.    Historical Provider, MD  pyridOXINE (VITAMIN B-6) 50 MG tablet Take 50 mg by mouth daily.    Historical Provider, MD  sertraline (ZOLOFT) 50 MG tablet Take 50 mg by mouth daily.    Historical Provider, MD  simvastatin (ZOCOR) 20 MG tablet Take 20 mg by mouth daily.    Historical Provider, MD  tamsulosin (FLOMAX) 0.4 MG CAPS capsule Take 0.4 mg by mouth daily.     Historical Provider, MD  warfarin (COUMADIN) 5 MG tablet Take 2.5-5 mg by mouth daily. Take 1 tab Monday-Thursday and 0.5 tab Friday-Sunday    Historical Provider, MD    Allergies Patient has no known allergies.  Family History  Problem Relation Age of Onset  . Hypertension      Social History Social History  Substance Use Topics  . Smoking status: Never Smoker  . Smokeless tobacco: Never Used  . Alcohol use No    Review of Systems  Constitutional: Negative for fever. Eyes: Negative for visual changes. ENT: Negative for sore throat. Neck: No neck pain  Cardiovascular: Negative for chest pain. Respiratory: + shortness of breath. Gastrointestinal: Negative for abdominal pain, vomiting or diarrhea. Genitourinary: Negative for dysuria. Musculoskeletal: Negative for back pain. + b/l LE edema Skin: Negative for rash. Neurological: Negative for headaches, weakness or numbness. Psych: No SI or HI  ____________________________________________   PHYSICAL EXAM:  VITAL SIGNS: ED Triage Vitals  Enc Vitals Group     BP 07/20/16 1700 109/87     Pulse Rate 07/20/16 1700 100     Resp 07/20/16 1700 (!) 28     Temp 07/20/16 1700 99.1 F (37.3 C)     Temp Source 07/20/16 1700 Oral     SpO2 07/20/16 1700 100 %       Weight 07/20/16 1702 204 lb 2.3 oz (92.6 kg)     Height 07/20/16 1702 5\' 10"  (1.778 m)     Head Circumference --      Peak Flow --      Pain Score --      Pain Loc --      Pain Edu? --      Excl. in [place]? --     Constitutional: Alert and oriented, mild respiratory distress.  HEENT:      Head: Normocephalic and atraumatic.         Eyes: Conjunctivae are normal. Sclera is non-icteric. EOMI. PERRL      Mouth/Throat: Mucous membranes are moist.  Neck: Supple with no signs of meningismus. Cardiovascular: Irregularly irregular rhythm with a tachycardic rate. No murmurs, gallops, or rubs. 2+ symmetrical distal pulses are present in all extremities. JVD to the level of the jaw Respiratory: Increased work of breathing, hypoxic on room air requiring 2 L nasal cannula, faint diffuse wheezes, no crackles  Gastrointestinal: Soft, non tender, and non distended with positive bowel sounds. No rebound or guarding. Musculoskeletal: 2+ pitting edema on b/l LE Neurologic: Normal speech and language. Face is symmetric. Moving all extremities. No gross focal neurologic deficits are appreciated. Skin: Skin is warm, dry and intact. No rash noted. Psychiatric: Mood and affect are normal. Speech and behavior are normal.  ____________________________________________   LABS (all labs ordered are listed, but only abnormal results are displayed)  Labs Reviewed  CBC WITH DIFFERENTIAL/PLATELET - Abnormal; Notable for the following:       Result Value   WBC 12.0 (*)    RBC 3.26 (*)    Hemoglobin 10.9 (*)    HCT 32.3 (*)    RDW 15.7 (*)    Neutro Abs 9.1 (*)    Monocytes Absolute 1.4 (*)    All other components within normal limits  BASIC METABOLIC PANEL - Abnormal; Notable for the following:    BUN 24 (*)    Creatinine, Ser 1.36 (*)    Calcium 8.6 (*)    GFR calc non Af Amer 50 (*)    GFR calc Af Amer 58 (*)    All other components within normal limits  BRAIN NATRIURETIC PEPTIDE - Abnormal;  Notable for the following:    B Natriuretic Peptide 657.0 (*)    All other components within normal limits  TROPONIN I - Abnormal; Notable for the following:    Troponin I 0.03 (*)    All other components within normal limits  PROTIME-INR - Abnormal; Notable for the following:    Prothrombin Time 27.9 (*)    All other components within normal limits   ____________________________________________  EKG  ED ECG REPORT I, Rudene Re, the attending physician, personally viewed and interpreted this ECG.  Afib, rate 112, occasional PVCs, normal QRS and QTc intervals, normal axis, no ST elevations or depressions. Unchanged from prior. ____________________________________________  RADIOLOGY  CXR:  No active process evident. Mild cardiomegaly. Aortic atherosclerosis. Chronic left base scarring. ____________________________________________   PROCEDURES  Procedure(s) performed: None Procedures Critical Care performed: yes  CRITICAL CARE Performed by: Rudene Re  ?  Total critical care time: 35 min  Critical care time was exclusive of separately billable procedures and treating other patients.  Critical care was necessary to treat or prevent imminent or life-threatening deterioration.  Critical care was time spent personally by me on the following activities: development of treatment plan with patient and/or surrogate as well as nursing, discussions with consultants, evaluation of patient's response to treatment, examination of patient, obtaining history from patient or surrogate, ordering and performing treatments and interventions, ordering and review of laboratory studies, ordering and review of radiographic studies, pulse oximetry and re-evaluation of patient's condition.  ____________________________________________   INITIAL IMPRESSION / ASSESSMENT AND PLAN / ED COURSE   76 y.o. male with a history of atrial fibrillation on Coumadin, CHF, hypertension,  hyperlipidemia who presents for evaluation of severe shortness of breath. Patient is in mild to moderate respiratory distress, wheezes on his lungs, elevated JVD, 2+ pitting edema in bilateral lower extremities, requiring 2 L nasal cannula concerning for CHF exacerbation. No evidence of PNA with no fever,  cough, or chills. Afib with Well-controlled rate. Presentation concerning for CHF exacerbation. Will get chest x-ray, basic blood work, and diuresis patient with IV Lasix.    _________________________ 7:15 PM on 07/20/2016 ----------------------------------------- BNP elevated at 657, chest x-ray with cardiomegaly, elevated JVD and bilateral pitting edema, and new oxygen requirement concerning for COPD exacerbation. Patient was given 80 of IV Lasix. Patient be admitted to the hospitalist service for new oxygen requirement.   Pertinent labs & imaging results that were available during my care of the patient were reviewed by me and considered in my medical decision making (see chart for details).    ____________________________________________   FINAL CLINICAL IMPRESSION(S) / ED DIAGNOSES  Final diagnoses:  Acute respiratory failure with hypoxia (HCC)  Acute on chronic congestive heart failure, unspecified heart failure type ([place])      NEW MEDICATIONS STARTED DURING THIS VISIT:  New Prescriptions   No medications on file     Note:  This document was prepared using Dragon voice recognition software and may include unintentional dictation errors.    Rudene Re, MD 07/20/16 1919

## 2016-07-20 NOTE — ED Notes (Signed)
Patient left for imaging. 

## 2016-07-20 NOTE — H&P (Signed)
[place] [facility] [place] NAME: Dylan Faulkner    MR#:  [id number]  DATE OF BIRTH:  09-28-1940  DATE OF ADMISSION:  07/20/2016  PRIMARY CARE PHYSICIAN: Donnie Coffin, MD   REQUESTING/REFERRING PHYSICIAN: Dr Gonzella Lex  CHIEF COMPLAINT:   Chief Complaint  Patient presents with  . Respiratory Distress    HISTORY OF PRESENT ILLNESS:  Dylan Faulkner  is a 76 y.o. male presented with respiratory distress. When EMS arrived his pulse ox was 83%. Patient was having trouble breathing. The patient states that he has dizzy spells. He has a history of vertigo where he takes meclizine. He's been having dry heaves. Today his face was more swollen and he couldn't breathe and he told his wife call 911. He's been having some swelling of his lower extremities. He's been having falls at home.  PAST MEDICAL HISTORY:   Past Medical History:  Diagnosis Date  . A-fib ([place])   . Asthma   . Atrial fibrillation ([place])   . BPH (benign prostatic hyperplasia)   . CHF (congestive heart failure) ([place])   . Enlarged prostate   . Heart failure ([place])   . Hyperlipidemia   . Hypertension   . 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 Renal insufficiency   . Vertigo     PAST SURGICAL HISTORY:   Past Surgical History:  Procedure Laterality Date  . BICEPS TENDON REPAIR    . HERNIA REPAIR    . neck fusion      SOCIAL HISTORY:   Social History  Substance Use Topics  . Smoking status: Never Smoker  . Smokeless tobacco: Never Used  . Alcohol use No    FAMILY HISTORY:   Family History  Problem Relation Age of Onset  . Hypertension    . Dementia Mother   . Kidney failure Father   . Ovarian cancer Sister   . Lung cancer Brother     DRUG ALLERGIES:  No Known Allergies  REVIEW OF SYSTEMS:  CONSTITUTIONAL: No fever. Positive for chills. Positive weight loss from 185 down to 157. Positive for fatigue. EYES: Occasional blurred vision EARS, NOSE, AND  THROAT: No tinnitus or ear pain. No sore throat. Positive for vertigo. Decreased hearing. Positive for runny nose. RESPIRATORY: Positive for cough and shortness of breath, no wheezing or hemoptysis.  CARDIOVASCULAR: No chest pain, positive for edema.  GASTROINTESTINAL: Positive for dry heaves. No vomiting or abdominal pain. No blood in bowel movements. Occasional diarrhea GENITOURINARY: No dysuria, hematuria.  ENDOCRINE: No polyuria, nocturia,  HEMATOLOGY: No anemia, easy bruising or bleeding SKIN: No rash or lesion. MUSCULOSKELETAL: Positive for joint pain NEUROLOGIC: No tingling, numbness, weakness.  PSYCHIATRY: No anxiety or depression.   MEDICATIONS AT HOME:   Prior to Admission medications   Medication Sig Start Date End Date Taking? Authorizing Provider  acetaminophen (TYLENOL) 500 MG tablet Take 1,000 mg by mouth every 6 (six) hours as needed.   Yes Historical Provider, MD  albuterol (PROVENTIL HFA;VENTOLIN HFA) 108 (90 Base) MCG/ACT inhaler Inhale 2 puffs into the lungs every 6 (six) hours as needed for wheezing or shortness of breath. 03/26/16  Yes Rudene Re, MD  chlorthalidone (HYGROTON) 25 MG tablet Take 12.5 mg by mouth daily.   Yes Historical Provider, MD  cyanocobalamin 500 MCG tablet Take 500 mcg by mouth daily.   Yes Historical Provider, MD  cyclobenzaprine (FLEXERIL) 10 MG tablet Take 10 mg by mouth 2 (two) times daily.  Yes Historical Provider, MD  finasteride (PROSCAR) 5 MG tablet Take 5 mg by mouth daily.   Yes Historical Provider, MD  fluticasone (FLONASE) 50 MCG/ACT nasal spray Place 2 sprays into both nostrils daily as needed for allergies or rhinitis.   Yes Historical Provider, MD  Fluticasone-Salmeterol (ADVAIR) 250-50 MCG/DOSE AEPB Inhale 1 puff into the lungs 2 (two) times daily.   Yes Historical Provider, MD  folic acid (FOLVITE) 1 MG tablet Take 1 mg by mouth daily.   Yes Historical Provider, MD  furosemide (LASIX) 20 MG tablet Take 1 tablet (20 mg  total) by mouth 2 (two) times daily. Patient taking differently: Take 40 mg by mouth 2 (two) times daily.  03/26/16 03/26/17 Yes Rudene Re, MD  gabapentin (NEURONTIN) 600 MG tablet Take 1 tablet (600 mg total) by mouth 2 (two) times daily. Patient taking differently: Take 600 mg by mouth 3 (three) times daily.  10/24/15  Yes Lytle Butte, MD  HYDROcodone-acetaminophen [facility] 10-325 MG tablet Take 1 tablet by mouth 3 (three) times daily as needed.   Yes Historical Provider, MD  isosorbide mononitrate (IMDUR) 30 MG 24 hr tablet Take 1 tablet (30 mg total) by mouth daily. 05/16/15  Yes Sital Mody, MD  lisinopril (PRINIVIL,ZESTRIL) 20 MG tablet Take 20 mg by mouth daily.    Yes Historical Provider, MD  loratadine (CLARITIN) 10 MG tablet Take 10 mg by mouth daily.   Yes Historical Provider, MD  meclizine (ANTIVERT) 25 MG tablet Take 25 mg by mouth 3 (three) times daily as needed for dizziness.   Yes Historical Provider, MD  metoprolol tartrate (LOPRESSOR) 25 MG tablet Take 25 mg by mouth 2 (two) times daily.   Yes Historical Provider, MD  montelukast (SINGULAIR) 10 MG tablet Take 10 mg by mouth daily.   Yes Historical Provider, MD  Multiple Vitamin (MULTIVITAMIN WITH MINERALS) TABS tablet Take 1 tablet by mouth daily.   Yes Historical Provider, MD  nitroGLYCERIN (NITROSTAT) 0.4 MG SL tablet Place 0.4 mg under the tongue every 5 (five) minutes as needed for chest pain.   Yes Historical Provider, MD  omeprazole (PRILOSEC) 20 MG capsule Take 20 mg by mouth daily.   Yes Historical Provider, MD  Potassium Chloride ER 20 MEQ TBCR Take 20 mEq by mouth daily.    Yes Historical Provider, MD  pyridOXINE (VITAMIN B-6) 50 MG tablet Take 50 mg by mouth daily.   Yes Historical Provider, MD  sertraline (ZOLOFT) 50 MG tablet Take 50 mg by mouth daily.   Yes Historical Provider, MD  simvastatin (ZOCOR) 20 MG tablet Take 20 mg by mouth daily.   Yes Historical Provider, MD  tamsulosin (FLOMAX) 0.4 MG CAPS capsule  Take 0.4 mg by mouth daily.    Yes Historical Provider, MD  warfarin (COUMADIN) 5 MG tablet Take 2.5-5 mg by mouth daily. Take 1 tab Monday-Thursday and 0.5 tab Friday-Sunday   Yes Historical Provider, MD      VITAL SIGNS:  Blood pressure 136/86, pulse 92, temperature 99.1 F (37.3 C), temperature source Oral, resp. rate (!) 25, height 5\' 10"  (1.778 m), weight 92.6 kg (204 lb 2.3 oz), SpO2 98 %.  PHYSICAL EXAMINATION:  GENERAL:  76 y.o.-year-old patient lying in the bed with no acute distress.  EYES: Pupils equal, round, reactive to light and accommodation. No scleral icterus. Extraocular muscles intact.  HEENT: Head atraumatic, normocephalic. Oropharynx and nasopharynx clear. Tympanic membrane bilateral erythematous NECK:  Supple, no jugular venous distention. No thyroid enlargement, no tenderness.  LUNGS: Decreased breath sounds bilaterally, no wheezing, rales,rhonchi or crepitation. No use of accessory muscles of respiration.  CARDIOVASCULAR: S1, S2 irregularly irregular. No murmurs, rubs, or gallops.  ABDOMEN: Soft, nontender, nondistended. Bowel sounds present. No organomegaly or mass.  EXTREMITIES: 2+ edema, no cyanosis, or clubbing.  NEUROLOGIC: Cranial nerves II through XII are intact. Muscle strength 5/5 in all extremities. Sensation intact. Gait not checked.  PSYCHIATRIC: The patient is alert and answers some yes or no questions.  SKIN: No rash, lesion, or ulcer.   LABORATORY PANEL:   CBC  Recent Labs Lab 07/20/16 1721  WBC 12.0*  HGB 10.9*  HCT 32.3*  PLT 211   ------------------------------------------------------------------------------------------------------------------  Chemistries   Recent Labs Lab 07/20/16 1721  NA 137  K 3.7  CL 104  CO2 27  GLUCOSE 99  BUN 24*  CREATININE 1.36*  CALCIUM 8.6*   ------------------------------------------------------------------------------------------------------------------  Cardiac Enzymes  Recent Labs Lab  07/20/16 1721  TROPONINI 0.03*   ------------------------------------------------------------------------------------------------------------------  RADIOLOGY:  Dg Chest 2 View  Result Date: 07/20/2016 CLINICAL DATA:  Worsening shortness of breath over the last day. EXAM: CHEST  2 VIEW COMPARISON:  06/10/2016 FINDINGS: Chronic cardiomegaly. Chronic aortic atherosclerosis. The lungs are clear except for mild chronic scarring at the left base. No sign of infiltrate, mass, effusion or collapse. No significant bone finding. IMPRESSION: No active process evident. Mild cardiomegaly. Aortic atherosclerosis. Chronic left base scarring. Electronically Signed   By: Nelson Chimes M.D.   On: 07/20/2016 18:07    EKG:   Atrial fibrillation 112 bpm  IMPRESSION AND PLAN:   1. Acute respiratory hypoxic failure. Pulse ox 83% by EMS. Oxygen supplementation. 2. Acute diastolic congestive heart failure. Patient given Lasix 80 mg IV 1. We'll continue that dose twice a day starting tomorrow. 3. Rapid atrial fibrillation. Metoprolol in the prescription writer which I will order but it seems like cardiology may have switched him to [place] as outpatient. I use the metoprolol for right now. 4. Vertigo on when necessary meclizine. Physical therapy consultation  5. Chronic kidney disease stage III monitor with diuresis 6. Hyperlipidemia unspecified on simvastatin 7. Essential hypertension hold [place] at this point since the patient is on IV Lasix 8. BPH on Flomax 9. History of asthma we'll put on DuoNeb nebulizer solution 10. Otitis media we'll put on Augmentin    All the records are reviewed and case discussed with ED provider. Management plans discussed with the patient, family and they are in agreement.  CODE STATUS: Full code  TOTAL TIME TAKING CARE OF THIS PATIENT: 50 minutes.    Loletha Grayer M.D on 07/20/2016 at 8:50 PM  Between 7am to 6pm - Pager - [phone]  After 6pm call admission  pager [phone]  [facility]  [phone]  CC: Primary care physician; Donnie Coffin, MD

## 2016-07-20 NOTE — ED Triage Notes (Signed)
Per EMS report, patient c/o worsening shortness of breath for one day. Patient c/o increased dyspnea with exertion. Patient does not have home O2. Patient was 90% on Room air. Patient placed on 2L O2 via [place].

## 2016-07-21 LAB — CBC
HCT: 32 % — ABNORMAL LOW (ref 40.0–52.0)
Hemoglobin: 11 g/dL — ABNORMAL LOW (ref 13.0–18.0)
MCH: 34 pg (ref 26.0–34.0)
MCHC: 34.5 g/dL (ref 32.0–36.0)
MCV: 98.5 fL (ref 80.0–100.0)
Platelets: 195 10*3/uL (ref 150–440)
RBC: 3.25 MIL/uL — AB (ref 4.40–5.90)
RDW: 15.4 % — ABNORMAL HIGH (ref 11.5–14.5)
WBC: 9.2 10*3/uL (ref 3.8–10.6)

## 2016-07-21 LAB — BASIC METABOLIC PANEL
ANION GAP: 6 (ref 5–15)
BUN: 24 mg/dL — ABNORMAL HIGH (ref 6–20)
CO2: 28 mmol/L (ref 22–32)
Calcium: 8.6 mg/dL — ABNORMAL LOW (ref 8.9–10.3)
Chloride: 103 mmol/L (ref 101–111)
Creatinine, Ser: 1.48 mg/dL — ABNORMAL HIGH (ref 0.61–1.24)
GFR, EST AFRICAN AMERICAN: 52 mL/min — AB (ref >60)
GFR, EST NON AFRICAN AMERICAN: 45 mL/min — AB (ref >60)
Glucose, Bld: 106 mg/dL — ABNORMAL HIGH (ref 65–99)
POTASSIUM: 3.3 mmol/L — AB (ref 3.5–5.1)
SODIUM: 137 mmol/L (ref 135–145)

## 2016-07-21 LAB — PROTIME-INR
INR: 2.7
Prothrombin Time: 29.2 seconds — ABNORMAL HIGH (ref 11.4–15.2)

## 2016-07-21 MED ORDER — IPRATROPIUM-ALBUTEROL 0.5-2.5 (3) MG/3ML IN SOLN
3.0000 mL | Freq: Two times a day (BID) | RESPIRATORY_TRACT | Status: DC
  Administered 2016-07-21 – 2016-07-22 (×2): 3 mL via RESPIRATORY_TRACT
  Filled 2016-07-21 (×2): qty 3

## 2016-07-21 MED ORDER — METOPROLOL TARTRATE 25 MG PO TABS
12.5000 mg | ORAL_TABLET | Freq: Two times a day (BID) | ORAL | Status: DC
  Administered 2016-07-21 – 2016-07-22 (×2): 12.5 mg via ORAL
  Filled 2016-07-21 (×2): qty 1

## 2016-07-21 MED ORDER — FUROSEMIDE 10 MG/ML IJ SOLN
40.0000 mg | Freq: Two times a day (BID) | INTRAMUSCULAR | Status: DC
  Administered 2016-07-21: 40 mg via INTRAVENOUS
  Filled 2016-07-21: qty 4

## 2016-07-21 NOTE — Progress Notes (Signed)
[facility] NAME: Dylan Faulkner    MR#:  [id number]  DATE OF BIRTH:  September 16, 1940  SUBJECTIVE:   Patient with improvement in LEE and SOB  REVIEW OF SYSTEMS:    Review of Systems  Constitutional: Negative for fever, chills weight loss HENT: Negative for ear pain, nosebleeds, congestion, facial swelling, rhinorrhea, neck pain, neck stiffness and ear discharge.   Respiratory: Negative for cough, (improved) shortness of breath, wheezing  Cardiovascular: Negative for chest pain, palpitations and+++ leg swelling.  Gastrointestinal: Negative for heartburn, abdominal pain, vomiting, diarrhea or consitpation Genitourinary: Negative for dysuria, urgency, frequency, hematuria Musculoskeletal: Negative for back pain or joint pain Neurological: Negative for dizziness, seizures, syncope, focal weakness,  numbness and headaches.  Hematological: Does not bruise/bleed easily.  Psychiatric/Behavioral: Negative for hallucinations, confusion, dysphoric mood    Tolerating Diet: yes      DRUG ALLERGIES:  No Known Allergies  VITALS:  Blood pressure 111/62, pulse (!) 50, temperature 98.4 F (36.9 C), temperature source Oral, resp. rate 17, height 5\' 10"  (1.778 m), weight 73.2 kg (161 lb 4.8 oz), SpO2 100 %.  PHYSICAL EXAMINATION:  Constitutional: Appears well-developed and well-nourished. No distress. HENT: Normocephalic. Marland Kitchen Oropharynx is clear and moist.  Eyes: Conjunctivae and EOM are normal. PERRLA, no scleral icterus.  Neck: Normal ROM. Neck supple. No JVD. No tracheal deviation. CVS: irr, irr S1/S2 +, no murmurs, no gallops, no carotid bruit.  Pulmonary: Effort and breath sounds normal, no stridor, rhonchi, wheezes, rales.  Abdominal: Soft. BS +,  no distension, tenderness, rebound or guarding.  Musculoskeletal: Normal range of motion. 1+edema and no tenderness.  Neuro: Alert. CN 2-12 grossly intact. No focal deficits. Skin: Skin is warm  and dry. No rash noted. Psychiatric: Normal mood and affect.      LABORATORY PANEL:   CBC  Recent Labs Lab 07/21/16 0605  WBC 9.2  HGB 11.0*  HCT 32.0*  PLT 195   ------------------------------------------------------------------------------------------------------------------  Chemistries   Recent Labs Lab 07/21/16 0605  NA 137  K 3.3*  CL 103  CO2 28  GLUCOSE 106*  BUN 24*  CREATININE 1.48*  CALCIUM 8.6*   ------------------------------------------------------------------------------------------------------------------  Cardiac Enzymes  Recent Labs Lab 07/20/16 1721  TROPONINI 0.03*   ------------------------------------------------------------------------------------------------------------------  RADIOLOGY:  Dg Chest 2 View  Result Date: 07/20/2016 CLINICAL DATA:  Worsening shortness of breath over the last day. EXAM: CHEST  2 VIEW COMPARISON:  06/10/2016 FINDINGS: Chronic cardiomegaly. Chronic aortic atherosclerosis. The lungs are clear except for mild chronic scarring at the left base. No sign of infiltrate, mass, effusion or collapse. No significant bone finding. IMPRESSION: No active process evident. Mild cardiomegaly. Aortic atherosclerosis. Chronic left base scarring. Electronically Signed   By: Nelson Chimes M.D.   On: 07/20/2016 18:07     ASSESSMENT AND PLAN:    76 year old male with chronic atrial fibrillation and chronic diastolic heart failure who presents with acute hypoxic respiratory failure.  1. Acute hypoxic respiratory failure in the setting of CHF exacerbation Patient has been weaned off of oxygen  2. Acute on chronic diastolic heart failure: Patient is diuresing well Continue Lasix 40 IV twice a day Monitor intake and output and daily weight BMP for a.m.  3. Atrial fibrillation: Continue metoprolol for heart rate control and Coumadin. Watch HR  4. BPH: Continue Proscar  5. Essential hypertension: Continue metoprolol watching  heart rate closely, lisinopril and isosorbide  6. Hyperlipidemia: Continue statin  7. Depression: Continue Zoloft  8.  Otitis media: Continue Augmentin  Management plans discussed with the patient and family and they are in agreement.  CODE STATUS: FULL  TOTAL TIME TAKING CARE OF THIS PATIENT: 27 minutes.     POSSIBLE D/C tomorrow, DEPENDING ON CLINICAL CONDITION.   Yoel Kaufhold M.D on 07/21/2016 at 12:51 PM  Between 7am to 6pm - Pager - [phone] After 6pm go to [email or web address] - password [email or web address] [facility]  Office  [phone]  CC: Primary care physician; Donnie Coffin, MD  Note: This dictation was prepared with Dragon dictation along with smaller phrase technology. Any transcriptional errors that result from this process are unintentional.

## 2016-07-21 NOTE — Consult Note (Signed)
[facility] Cardiology Consultation Note  Patient ID: Dylan Faulkner, MRN: [id number], DOB/AGE: 10-Nov-1940 77 y.o. Admit date: 07/20/2016   Date of Consult: 07/21/2016 Primary Physician: Donnie Coffin, MD Primary Cardiologist:None  Chief Complaint:  Chief Complaint  Patient presents with  . Respiratory Distress   Reason for Consult: heart failure  HPI: 76 y.o. male with the paroxysmal nonvalvular atrial fibrillation with rapid ventricular rate and acute diastolic dysfunction congestive heart failure multifactorial in nature including atrial fibrillation as well as chronic kidney disease stage III. The patient had progressive episodes of shortness of breath and extremity edema with pulmonary edema and was seen in the emergency room. At that time he was given intravenous Lasix which is significantly improved his symptoms. Additionally the patient does have some COPD for which he is improving as well. The patient has had atrial fibrillation in the past and has now atrial fibrillation with rapid ventricular rate now improved with medication management including metoprolol at a low dose. Current heart rate is 58 bpm. The patient has had other hypertension control with ACE inhibitor to help with his diastolic dysfunction heart failure. With minimal ambulation to the chair he is doing fairly well. He will need further anticoagulation  Past Medical History:  Diagnosis Date  . A-fib ([place])   . Asthma   . Atrial fibrillation ([place])   . BPH (benign prostatic hyperplasia)   . CHF (congestive heart failure) ([place])   . Enlarged prostate   . Heart failure ([place])   . Hyperlipidemia   . Hypertension   . 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 Renal insufficiency   . Vertigo       Surgical History:  Past Surgical History:  Procedure Laterality Date  . BICEPS TENDON REPAIR    . HERNIA REPAIR    . neck fusion       Home Meds: Prior to Admission medications   Medication Sig Start Date End Date  Taking? Authorizing Provider  acetaminophen (TYLENOL) 500 MG tablet Take 1,000 mg by mouth every 6 (six) hours as needed.   Yes Historical Provider, MD  albuterol (PROVENTIL HFA;VENTOLIN HFA) 108 (90 Base) MCG/ACT inhaler Inhale 2 puffs into the lungs every 6 (six) hours as needed for wheezing or shortness of breath. 03/26/16  Yes Rudene Re, MD  chlorthalidone (HYGROTON) 25 MG tablet Take 12.5 mg by mouth daily.   Yes Historical Provider, MD  cyanocobalamin 500 MCG tablet Take 500 mcg by mouth daily.   Yes Historical Provider, MD  cyclobenzaprine (FLEXERIL) 10 MG tablet Take 10 mg by mouth 2 (two) times daily.   Yes Historical Provider, MD  finasteride (PROSCAR) 5 MG tablet Take 5 mg by mouth daily.   Yes Historical Provider, MD  fluticasone (FLONASE) 50 MCG/ACT nasal spray Place 2 sprays into both nostrils daily as needed for allergies or rhinitis.   Yes Historical Provider, MD  Fluticasone-Salmeterol (ADVAIR) 250-50 MCG/DOSE AEPB Inhale 1 puff into the lungs 2 (two) times daily.   Yes Historical Provider, MD  folic acid (FOLVITE) 1 MG tablet Take 1 mg by mouth daily.   Yes Historical Provider, MD  furosemide (LASIX) 20 MG tablet Take 1 tablet (20 mg total) by mouth 2 (two) times daily. Patient taking differently: Take 40 mg by mouth 2 (two) times daily.  03/26/16 03/26/17 Yes Rudene Re, MD  gabapentin (NEURONTIN) 600 MG tablet Take 1 tablet (600 mg total) by mouth 2 (two) times daily. Patient taking differently: Take 600  mg by mouth 3 (three) times daily.  10/24/15  Yes Lytle Butte, MD  HYDROcodone-acetaminophen [facility]) 10-325 MG tablet Take 1 tablet by mouth 3 (three) times daily as needed.   Yes Historical Provider, MD  isosorbide mononitrate (IMDUR) 30 MG 24 hr tablet Take 1 tablet (30 mg total) by mouth daily. 05/16/15  Yes Sital Mody, MD  lisinopril (PRINIVIL,ZESTRIL) 20 MG tablet Take 20 mg by mouth daily.    Yes Historical Provider, MD  loratadine (CLARITIN) 10 MG tablet  Take 10 mg by mouth daily.   Yes Historical Provider, MD  meclizine (ANTIVERT) 25 MG tablet Take 25 mg by mouth 3 (three) times daily as needed for dizziness.   Yes Historical Provider, MD  metoprolol tartrate (LOPRESSOR) 25 MG tablet Take 25 mg by mouth 2 (two) times daily.   Yes Historical Provider, MD  montelukast (SINGULAIR) 10 MG tablet Take 10 mg by mouth daily.   Yes Historical Provider, MD  Multiple Vitamin (MULTIVITAMIN WITH MINERALS) TABS tablet Take 1 tablet by mouth daily.   Yes Historical Provider, MD  nitroGLYCERIN (NITROSTAT) 0.4 MG SL tablet Place 0.4 mg under the tongue every 5 (five) minutes as needed for chest pain.   Yes Historical Provider, MD  omeprazole (PRILOSEC) 20 MG capsule Take 20 mg by mouth daily.   Yes Historical Provider, MD  Potassium Chloride ER 20 MEQ TBCR Take 20 mEq by mouth daily.    Yes Historical Provider, MD  pyridOXINE (VITAMIN B-6) 50 MG tablet Take 50 mg by mouth daily.   Yes Historical Provider, MD  sertraline (ZOLOFT) 50 MG tablet Take 50 mg by mouth daily.   Yes Historical Provider, MD  simvastatin (ZOCOR) 20 MG tablet Take 20 mg by mouth daily.   Yes Historical Provider, MD  tamsulosin (FLOMAX) 0.4 MG CAPS capsule Take 0.4 mg by mouth daily.    Yes Historical Provider, MD  warfarin (COUMADIN) 5 MG tablet Take 2.5-5 mg by mouth daily. Take 1 tab Monday-Thursday and 0.5 tab Friday-Sunday   Yes Historical Provider, MD    Inpatient Medications:  . amoxicillin-clavulanate  1 tablet Oral BID  . finasteride  5 mg Oral Daily  . folic acid  1 mg Oral Daily  . furosemide  40 mg Intravenous BID  . gabapentin  600 mg Oral TID  . ipratropium-albuterol  3 mL Nebulization BID  . isosorbide mononitrate  30 mg Oral Daily  . lisinopril  20 mg Oral Daily  . loratadine  10 mg Oral Daily  . metoprolol tartrate  12.5 mg Oral BID  . mometasone-formoterol  2 puff Inhalation BID  . montelukast  10 mg Oral Daily  . multivitamin with minerals  1 tablet Oral Daily  .  pantoprazole  40 mg Oral Daily  . potassium chloride  20 mEq Oral Daily  . pyridOXINE  50 mg Oral Daily  . sertraline  50 mg Oral Daily  . simvastatin  20 mg Oral q1800  . tamsulosin  0.4 mg Oral Daily  . vitamin B-12  500 mcg Oral Daily  . warfarin  5 mg Oral Once per day on Mon Tue Wed Thu   And  . [START ON 07/23/2016] warfarin  2.5 mg Oral Once per day on Sun Fri Sat  . Warfarin - Pharmacist Dosing Inpatient   Does not apply q1800     Allergies: No Known Allergies  Social History   Social History  . Marital status: Married    Spouse name: N/A  .  Number of children: N/A  . Years of education: N/A   Occupational History  . Not on file.   Social History Main Topics  . Smoking status: Never Smoker  . Smokeless tobacco: Never Used  . Alcohol use No  . Drug use: No  . Sexual activity: Yes   Other Topics Concern  . Not on file   Social History Narrative   Patient is illiterate     Family History  Problem Relation Age of Onset  . Hypertension    . Dementia Mother   . Kidney failure Father   . Ovarian cancer Sister   . Lung cancer Brother      Review of Systems Positive forShortness of breath Negative for: General:  chills, fever, night sweats or weight changes.  Cardiovascular: PND orthopnea syncope dizziness  Dermatological skin lesions rashes Respiratory: Cough congestion Urologic: Frequent urination urination at night and hematuria Abdominal: negative for nausea, vomiting, diarrhea, bright red blood per rectum, melena, or hematemesis Neurologic: negative for visual changes, and/or hearing changes  All other systems reviewed and are otherwise negative except as noted above.  Labs:  Recent Labs  07/20/16 1721  TROPONINI 0.03*   Lab Results  Component Value Date   WBC 9.2 07/21/2016   HGB 11.0 (L) 07/21/2016   HCT 32.0 (L) 07/21/2016   MCV 98.5 07/21/2016   PLT 195 07/21/2016    Recent Labs Lab 07/21/16 0605  NA 137  K 3.3*  CL 103  CO2 28   BUN 24*  CREATININE 1.48*  CALCIUM 8.6*  GLUCOSE 106*   Lab Results  Component Value Date   CHOL 121 06/19/2011   HDL 41 06/19/2011   LDLCALC 61 06/19/2011   TRIG 97 06/19/2011   No results found for: DDIMER  Radiology/Studies:  Dg Chest 2 View  Result Date: 07/20/2016 CLINICAL DATA:  Worsening shortness of breath over the last day. EXAM: CHEST  2 VIEW COMPARISON:  06/10/2016 FINDINGS: Chronic cardiomegaly. Chronic aortic atherosclerosis. The lungs are clear except for mild chronic scarring at the left base. No sign of infiltrate, mass, effusion or collapse. No significant bone finding. IMPRESSION: No active process evident. Mild cardiomegaly. Aortic atherosclerosis. Chronic left base scarring. Electronically Signed   By: Nelson Chimes M.D.   On: 07/20/2016 18:07    WFU:XNATFT fibrillation with nonspecific stand T-wave changes  Weights: Filed Weights   07/20/16 1702 07/20/16 2158  Weight: 92.6 kg (204 lb 2.3 oz) 73.2 kg (161 lb 4.8 oz)     Physical Exam: Blood pressure 111/62, pulse (!) 50, temperature 98.4 F (36.9 C), temperature source Oral, resp. rate 17, height 5\' 10"  (1.778 m), weight 73.2 kg (161 lb 4.8 oz), SpO2 100 %. Body mass index is 23.14 kg/m. General: Well developed, well nourished, in no acute distress. Head eyes ears nose throat: Normocephalic, atraumatic, sclera non-icteric, no xanthomas, nares are without discharge. No apparent thyromegaly and/or mass  Lungs: Normal respiratory effort.Few wheezes, some rales, no rhonchi.  Heart:Irregular with normal S1 S2. 2+ apical murmur gallop, no rub, PMI is normal size and placement, carotid upstroke normal without bruit, jugular venous pressure is normal Abdomen: Soft, non-tender, non-distended with normoactive bowel sounds. No hepatomegaly. No rebound/guarding. No obvious abdominal masses. Abdominal aorta is normal size without bruit Extremities: No edema. no cyanosis, no clubbing, no ulcers  Peripheral : 2+ bilateral  upper extremity pulses, 2+ bilateral femoral pulses, 2+ bilateral dorsal pedal pulse Neuro: Alert and oriented. No facial asymmetry. No focal deficit. Moves all  extremities spontaneously. Musculoskeletal: Normal muscle tone without kyphosis Psych:  Responds to questions appropriately with a normal affect.    Assessment: 76 year old male with acute diastolic dysfunction congestive heart failure secondary to atrial fibrillation with rapid ventricular rate and chronic kidney disease improved with diuresis with furosemide and no current evidence of myocardial infarction  Plan: 1. Continue diuresis with intravenous Lasix with gentle treatment due to concerns of chronic kidney disease 2. Continue heart rate control and cardiomyopathy treatment with carvedilol with a goal heart rate between 60 and 90 bpm at rest 3. Consider addition of anticoagulation for further risk reduction in stroke with atrial fibrillation with further adjustments as an outpatient 4. ACE inhibitor for cardiomyopathy and or congestive heart failure with hypertension control 5. No further cardiac diagnostics necessary at this time 6. Continue ambulation and possible discharge to home if heart rate stable with further adjustments of medication management as an outpatient  Signed, Corey Skains M.D. [facility] 07/21/2016, 1:27 PM

## 2016-07-21 NOTE — Progress Notes (Signed)
ANTICOAGULATION CONSULT NOTE - Initial Consult  Pharmacy Consult for warfarin dosing and monitoring  Indication: atrial fibrillation  No Known Allergies  Patient Measurements: Height: 5\' 10"  (177.8 cm) Weight: 161 lb 4.8 oz (73.2 kg) IBW/kg (Calculated) : 73  Vital Signs: Temp: 97.9 F (36.6 C) (04/18 0401) Temp Source: Oral (04/18 0401) BP: 106/63 (04/18 0401) Pulse Rate: 65 (04/18 0401)  Labs:  Recent Labs  07/20/16 1721 07/21/16 0605  HGB 10.9* 11.0*  HCT 32.3* 32.0*  PLT 211 195  LABPROT 27.9* 29.2*  INR 2.55 2.70  CREATININE 1.36* 1.48*  TROPONINI 0.03*  --     Estimated Creatinine Clearance: 45.2 mL/min (A) (by C-G formula based on SCr of 1.48 mg/dL (H)).   Medical History: Past Medical History:  Diagnosis Date  . A-fib ([place])   . Asthma   . Atrial fibrillation ([place])   . BPH (benign prostatic hyperplasia)   . CHF (congestive heart failure) ([place])   . Enlarged prostate   . Heart failure ([place])   . Hyperlipidemia   . Hypertension   . 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 Renal insufficiency   . Vertigo     Assessment: 76 yo male PMH of A. Fib. Pharmacy consulted for warfarin dosing and monitoring. Patient takes warfarin outpatient and was admitted with therapeutic INR. Patient is receiving Augmentin.   Home Regimen: Warfarin 5mg : Mon, Tues, Wed, Thurs                             Warfarin 2.5mg  Fri, Sat, Sun   DATE  INR  DOSE 4/17  2.55  Warfarin 5mg  (At home)  4/18  2.7  Warfarin 5 mg  Goal of Therapy:  INR 2-3 Monitor platelets by anticoagulation protocol: Yes   Plan:  INR therapeutic. Continue current regimen. Will recheck INR tomorrow with AM labs.   Kayana Thoen A. Jordan Hawks, PharmD, BCPS Clinical Pharmacist 07/20/2016 11:49

## 2016-07-21 NOTE — Care Management (Signed)
Admitted with rapid atrial fib and heart failure. Independent in all adls, denies issues accessing medical care, obtaining medications or with transportation.  Current with  PCP.  Referral made to Heart Failure Clinic.  Current 02 02 requirements acute.  Placed notice for home 02 assessment

## 2016-07-22 LAB — PROTIME-INR
INR: 2.24
Prothrombin Time: 25.2 seconds — ABNORMAL HIGH (ref 11.4–15.2)

## 2016-07-22 LAB — BASIC METABOLIC PANEL
Anion gap: 6 (ref 5–15)
BUN: 31 mg/dL — AB (ref 6–20)
CO2: 31 mmol/L (ref 22–32)
CREATININE: 1.91 mg/dL — AB (ref 0.61–1.24)
Calcium: 8.7 mg/dL — ABNORMAL LOW (ref 8.9–10.3)
Chloride: 99 mmol/L — ABNORMAL LOW (ref 101–111)
GFR calc Af Amer: 38 mL/min — ABNORMAL LOW (ref >60)
GFR calc non Af Amer: 33 mL/min — ABNORMAL LOW (ref >60)
GLUCOSE: 120 mg/dL — AB (ref 65–99)
Potassium: 3.8 mmol/L (ref 3.5–5.1)
SODIUM: 136 mmol/L (ref 135–145)

## 2016-07-22 MED ORDER — FUROSEMIDE 20 MG PO TABS: 20.0000 mg | ORAL_TABLET | Freq: Two times a day (BID) | ORAL | 1 refills | Status: DC

## 2016-07-22 MED ORDER — GABAPENTIN 600 MG PO TABS: 600.0000 mg | ORAL_TABLET | Freq: Three times a day (TID) | ORAL | 0 refills | Status: DC

## 2016-07-22 MED ORDER — AMOXICILLIN-POT CLAVULANATE 500-125 MG PO TABS: 1.0000 | ORAL_TABLET | Freq: Two times a day (BID) | ORAL | 0 refills | Status: AC

## 2016-07-22 NOTE — Discharge Instructions (Signed)
Heart Failure Clinic appointment on August 02, 2016 at 10:40am with Darylene Price, [place]. Please call [phone] to reschedule.

## 2016-07-22 NOTE — Discharge Summary (Signed)
[facility] [place] NAME: Dylan Faulkner    MR#:  [id number]  DATE OF BIRTH:  [birth year]  DATE OF ADMISSION:  07/20/2016 ADMITTING PHYSICIAN: Loletha Grayer, MD  DATE OF DISCHARGE: 07/22/2016  PRIMARY CARE PHYSICIAN: Donnie Coffin, MD    ADMISSION DIAGNOSIS:  Acute respiratory failure with hypoxia (HCC) [J96.01] Acute on chronic congestive heart failure, unspecified heart failure type ([place]) [I50.9]  DISCHARGE DIAGNOSIS:  CHF exacerbation  SECONDARY DIAGNOSIS:   Past Medical History:  Diagnosis Date  . A-fib ([place])   . Asthma   . Atrial fibrillation ([place])   . BPH (benign prostatic hyperplasia)   . CHF (congestive heart failure) ([place])   . Enlarged prostate   . Heart failure ([place])   . Hyperlipidemia   . Hypertension   . 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 Renal insufficiency   . Vertigo     HOSPITAL COURSE:  76 year old male with chronic atrial fibrillation and chronic diastolic heart failure who presents with acute hypoxic respiratory failure.  1. Acute hypoxic respiratory failure in the setting of CHF exacerbation Patient has been weaned off of oxygen  2. Acute on chronic diastolic heart failure: Patient has diuresed well. He actually is over diuresis. I have asked the patient to stop his oral Lasix for today and tomorrow and may resume on Saturday. He was referred to CHF clinic.  3. Atrial fibrillation: Continue metoprolol for heart rate control and Coumadin. According to the PCP note patient was supposed to stop metoprolol and start cord. Patient has not done so. Therefore I will discharge patient on metoprolol.  4. BPH: Continue Proscar  5. Essential hypertension: Continue metoprolol watching heart rate closely, lisinopril and isosorbide  6. Hyperlipidemia: Continue statin  7. Depression: Continue Zoloft  8. Otitis media: Asian should continue Augmentin   DISCHARGE CONDITIONS AND DIET:   Stable for  discharge on heart healthy diet  CONSULTS OBTAINED:  Treatment Team:  Corey Skains, MD  DRUG ALLERGIES:  No Known Allergies  DISCHARGE MEDICATIONS:   Current Discharge Medication List    START taking these medications   Details  amoxicillin-clavulanate (AUGMENTIN) 500-125 MG tablet Take 1 tablet (500 mg total) by mouth 2 (two) times daily. Qty: 14 tablet, Refills: 0      CONTINUE these medications which have CHANGED   Details  furosemide (LASIX) 20 MG tablet Take 1 tablet (20 mg total) by mouth 2 (two) times daily. Qty: 60 tablet, Refills: 1    gabapentin (NEURONTIN) 600 MG tablet Take 1 tablet (600 mg total) by mouth 3 (three) times daily. Qty: 90 tablet, Refills: 0      CONTINUE these medications which have NOT CHANGED   Details  acetaminophen (TYLENOL) 500 MG tablet Take 1,000 mg by mouth every 6 (six) hours as needed.    albuterol (PROVENTIL HFA;VENTOLIN HFA) 108 (90 Base) MCG/ACT inhaler Inhale 2 puffs into the lungs every 6 (six) hours as needed for wheezing or shortness of breath. Qty: 1 Inhaler, Refills: 2    cyanocobalamin 500 MCG tablet Take 500 mcg by mouth daily.    cyclobenzaprine (FLEXERIL) 10 MG tablet Take 10 mg by mouth 2 (two) times daily.    finasteride (PROSCAR) 5 MG tablet Take 5 mg by mouth daily.    fluticasone (FLONASE) 50 MCG/ACT nasal spray Place 2 sprays into both nostrils daily as needed for allergies or rhinitis.    Fluticasone-Salmeterol (ADVAIR) 250-50 MCG/DOSE AEPB Inhale 1  puff into the lungs 2 (two) times daily.    folic acid (FOLVITE) 1 MG tablet Take 1 mg by mouth daily.    HYDROcodone-acetaminophen (NORCO) 10-325 MG tablet Take 1 tablet by mouth 3 (three) times daily as needed.    isosorbide mononitrate (IMDUR) 30 MG 24 hr tablet Take 1 tablet (30 mg total) by mouth daily. Qty: 30 tablet, Refills: 0    lisinopril (PRINIVIL,ZESTRIL) 20 MG tablet Take 20 mg by mouth daily.     loratadine (CLARITIN) 10 MG tablet Take 10 mg by  mouth daily.    meclizine (ANTIVERT) 25 MG tablet Take 25 mg by mouth 3 (three) times daily as needed for dizziness.    metoprolol tartrate (LOPRESSOR) 25 MG tablet Take 25 mg by mouth 2 (two) times daily.    montelukast (SINGULAIR) 10 MG tablet Take 10 mg by mouth daily.    Multiple Vitamin (MULTIVITAMIN WITH MINERALS) TABS tablet Take 1 tablet by mouth daily.    nitroGLYCERIN (NITROSTAT) 0.4 MG SL tablet Place 0.4 mg under the tongue every 5 (five) minutes as needed for chest pain.    omeprazole (PRILOSEC) 20 MG capsule Take 20 mg by mouth daily.    Potassium Chloride ER 20 MEQ TBCR Take 20 mEq by mouth daily.     pyridOXINE (VITAMIN B-6) 50 MG tablet Take 50 mg by mouth daily.    sertraline (ZOLOFT) 50 MG tablet Take 50 mg by mouth daily.    simvastatin (ZOCOR) 20 MG tablet Take 20 mg by mouth daily.    tamsulosin (FLOMAX) 0.4 MG CAPS capsule Take 0.4 mg by mouth daily.     warfarin (COUMADIN) 5 MG tablet Take 2.5-5 mg by mouth daily. Take 1 tab Monday-Thursday and 0.5 tab Friday-Sunday      STOP taking these medications     chlorthalidone (HYGROTON) 25 MG tablet           Today   CHIEF COMPLAINT:  Doing well today no SOB   VITAL SIGNS:  Blood pressure 121/62, pulse 75, temperature 97.7 F (36.5 C), temperature source Oral, resp. rate 18, height 5\' 10"  (1.778 m), weight 73.2 kg (161 lb 4.8 oz), SpO2 95 %.   REVIEW OF SYSTEMS:  Review of Systems  Constitutional: Negative.  Negative for chills, fever and malaise/fatigue.  HENT: Negative.  Negative for ear discharge, ear pain, hearing loss, nosebleeds and sore throat.   Eyes: Negative.  Negative for blurred vision and pain.  Respiratory: Negative.  Negative for cough, hemoptysis, shortness of breath and wheezing.   Cardiovascular: Negative.  Negative for chest pain, palpitations and leg swelling.  Gastrointestinal: Negative.  Negative for abdominal pain, blood in stool, diarrhea, nausea and vomiting.   Genitourinary: Negative.  Negative for dysuria.  Musculoskeletal: Negative.  Negative for back pain.  Skin: Negative.   Neurological: Negative for dizziness, tremors, speech change, focal weakness, seizures and headaches.  Endo/Heme/Allergies: Negative.  Does not bruise/bleed easily.  Psychiatric/Behavioral: Negative.  Negative for depression, hallucinations and suicidal ideas.     PHYSICAL EXAMINATION:  GENERAL:  76 y.o.-year-old patient lying in the bed with no acute distress.  NECK:  Supple, no jugular venous distention. No thyroid enlargement, no tenderness.  LUNGS: Normal breath sounds bilaterally, no wheezing, rales,rhonchi  No use of accessory muscles of respiration.  CARDIOVASCULAR: S1, S2 normal. No murmurs, rubs, or gallops.  ABDOMEN: Soft, non-tender, non-distended. Bowel sounds present. No organomegaly or mass.  EXTREMITIES: No pedal edema, cyanosis, or clubbing.  PSYCHIATRIC: The patient is  alert and oriented x 3.  SKIN: No obvious rash, lesion, or ulcer.   DATA REVIEW:   CBC  Recent Labs Lab 07/21/16 0605  WBC 9.2  HGB 11.0*  HCT 32.0*  PLT 195    Chemistries   Recent Labs Lab 07/22/16 0532  NA 136  K 3.8  CL 99*  CO2 31  GLUCOSE 120*  BUN 31*  CREATININE 1.91*  CALCIUM 8.7*    Cardiac Enzymes  Recent Labs Lab 07/20/16 1721  TROPONINI 0.03*    Microbiology Results  @MICRORSLT48 @  RADIOLOGY:  Dg Chest 2 View  Result Date: 07/20/2016 CLINICAL DATA:  Worsening shortness of breath over the last day. EXAM: CHEST  2 VIEW COMPARISON:  06/10/2016 FINDINGS: Chronic cardiomegaly. Chronic aortic atherosclerosis. The lungs are clear except for mild chronic scarring at the left base. No sign of infiltrate, mass, effusion or collapse. No significant bone finding. IMPRESSION: No active process evident. Mild cardiomegaly. Aortic atherosclerosis. Chronic left base scarring. Electronically Signed   By: Nelson Chimes M.D.   On: 07/20/2016 18:07       Current Discharge Medication List    START taking these medications   Details  amoxicillin-clavulanate (AUGMENTIN) 500-125 MG tablet Take 1 tablet (500 mg total) by mouth 2 (two) times daily. Qty: 14 tablet, Refills: 0      CONTINUE these medications which have CHANGED   Details  furosemide (LASIX) 20 MG tablet Take 1 tablet (20 mg total) by mouth 2 (two) times daily. Qty: 60 tablet, Refills: 1    gabapentin (NEURONTIN) 600 MG tablet Take 1 tablet (600 mg total) by mouth 3 (three) times daily. Qty: 90 tablet, Refills: 0      CONTINUE these medications which have NOT CHANGED   Details  acetaminophen (TYLENOL) 500 MG tablet Take 1,000 mg by mouth every 6 (six) hours as needed.    albuterol (PROVENTIL HFA;VENTOLIN HFA) 108 (90 Base) MCG/ACT inhaler Inhale 2 puffs into the lungs every 6 (six) hours as needed for wheezing or shortness of breath. Qty: 1 Inhaler, Refills: 2    cyanocobalamin 500 MCG tablet Take 500 mcg by mouth daily.    cyclobenzaprine (FLEXERIL) 10 MG tablet Take 10 mg by mouth 2 (two) times daily.    finasteride (PROSCAR) 5 MG tablet Take 5 mg by mouth daily.    fluticasone (FLONASE) 50 MCG/ACT nasal spray Place 2 sprays into both nostrils daily as needed for allergies or rhinitis.    Fluticasone-Salmeterol (ADVAIR) 250-50 MCG/DOSE AEPB Inhale 1 puff into the lungs 2 (two) times daily.    folic acid (FOLVITE) 1 MG tablet Take 1 mg by mouth daily.    HYDROcodone-acetaminophen (NORCO) 10-325 MG tablet Take 1 tablet by mouth 3 (three) times daily as needed.    isosorbide mononitrate (IMDUR) 30 MG 24 hr tablet Take 1 tablet (30 mg total) by mouth daily. Qty: 30 tablet, Refills: 0    lisinopril (PRINIVIL,ZESTRIL) 20 MG tablet Take 20 mg by mouth daily.     loratadine (CLARITIN) 10 MG tablet Take 10 mg by mouth daily.    meclizine (ANTIVERT) 25 MG tablet Take 25 mg by mouth 3 (three) times daily as needed for dizziness.    metoprolol tartrate  (LOPRESSOR) 25 MG tablet Take 25 mg by mouth 2 (two) times daily.    montelukast (SINGULAIR) 10 MG tablet Take 10 mg by mouth daily.    Multiple Vitamin (MULTIVITAMIN WITH MINERALS) TABS tablet Take 1 tablet by mouth daily.    nitroGLYCERIN (  NITROSTAT) 0.4 MG SL tablet Place 0.4 mg under the tongue every 5 (five) minutes as needed for chest pain.    omeprazole (PRILOSEC) 20 MG capsule Take 20 mg by mouth daily.    Potassium Chloride ER 20 MEQ TBCR Take 20 mEq by mouth daily.     pyridOXINE (VITAMIN B-6) 50 MG tablet Take 50 mg by mouth daily.    sertraline (ZOLOFT) 50 MG tablet Take 50 mg by mouth daily.    simvastatin (ZOCOR) 20 MG tablet Take 20 mg by mouth daily.    tamsulosin (FLOMAX) 0.4 MG CAPS capsule Take 0.4 mg by mouth daily.     warfarin (COUMADIN) 5 MG tablet Take 2.5-5 mg by mouth daily. Take 1 tab Monday-Thursday and 0.5 tab Friday-Sunday      STOP taking these medications     chlorthalidone (HYGROTON) 25 MG tablet           Management plans discussed with the patient and he is in agreement. Stable for discharge home  Patient should follow up with pcp  CODE STATUS:     Code Status Orders        Start     Ordered   07/20/16 2045  Full code  Continuous     04 /17/18 2044    Code Status History    Date Active Date Inactive Code Status Order ID Comments User Context   10/22/2015  4:30 PM 10/24/2015  2:01 PM Full Code [id number]  Dustin Flock, MD ED   05/16/2015  3:42 AM 05/16/2015  3:49 PM Full Code [id number]  Harrie Foreman, MD Inpatient      TOTAL TIME TAKING CARE OF THIS PATIENT: 37 minutes.    Note: This dictation was prepared with Dragon dictation along with smaller phrase technology. Any transcriptional errors that result from this process are unintentional.  Perkins Molina M.D on 07/22/2016 at 9:06 AM  Between 7am to 6pm - Pager - [phone] After 6pm go to [email or web address] - password [email or web address]  Office   [phone]  CC: Primary care physician; Donnie Coffin, MD

## 2016-07-22 NOTE — Progress Notes (Signed)
ANTICOAGULATION CONSULT NOTE - Initial Consult  Pharmacy Consult for warfarin dosing and monitoring  Indication: atrial fibrillation  No Known Allergies  Patient Measurements: Height: 5\' 10"  (177.8 cm) Weight: 161 lb 4.8 oz (73.2 kg) IBW/kg (Calculated) : 73  Vital Signs: Temp: 97.9 F (36.6 C) (04/19 0434) Temp Source: Oral (04/19 0434) BP: 95/62 (04/19 0434) Pulse Rate: 71 (04/19 0434)  Labs:  Recent Labs  07/20/16 1721 07/21/16 0605 07/22/16 0532  HGB 10.9* 11.0*  --   HCT 32.3* 32.0*  --   PLT 211 195  --   LABPROT 27.9* 29.2* 25.2*  INR 2.55 2.70 2.24  CREATININE 1.36* 1.48* 1.91*  TROPONINI 0.03*  --   --     Estimated Creatinine Clearance: 35 mL/min (A) (by C-G formula based on SCr of 1.91 mg/dL (H)).   Medical History: Past Medical History:  Diagnosis Date  . A-fib ([place])   . Asthma   . Atrial fibrillation ([place])   . BPH (benign prostatic hyperplasia)   . CHF (congestive heart failure) ([place])   . Enlarged prostate   . Heart failure ([place])   . Hyperlipidemia   . Hypertension   . Leukemia ([place])    20 years ago  . Renal insufficiency   . Vertigo     Assessment: 76 yo male PMH of A. Fib. Pharmacy consulted for warfarin dosing and monitoring. Patient takes warfarin outpatient and was admitted with therapeutic INR. Patient is receiving Augmentin.   Home Regimen: Warfarin 5mg : Mon, Tues, Wed, Thurs                             Warfarin 2.5mg  Fri, Sat, Sun   DATE  INR  DOSE 4/17  2.55  Warfarin 5mg  (At home)  4/18  2.7  Warfarin 5 mg 4/19  2.24  Warfarin 5 mg  Goal of Therapy:  INR 2-3 Monitor platelets by anticoagulation protocol: Yes   Plan:  INR therapeutic. Continue current regimen. Will recheck INR tomorrow with AM labs.   Julieana Eshleman A. Jordan Hawks, PharmD, BCPS Clinical Pharmacist 07/22/2016 07:58

## 2016-07-22 NOTE — Care Management Important Message (Signed)
Important Message  Patient Details  Name: Dylan Faulkner MRN: [id number] Date of Birth: 06-Mar-1941   Medicare Important Message Given:  N/A - LOS <3 / Initial given by admissions    Katrina Stack, RN 07/22/2016, 12:09 PM

## 2016-07-22 NOTE — Care Management (Signed)
Patient for discharge home. He does have not have access to scales- CM provided.  Asked primary nurse to provide HF Education Booklet.  Referral to [facility] for SN and PT.  Patient's grandson lives with himKnute [phone].  Referrals has been made to Heart Failure Clinic

## 2016-07-22 NOTE — Evaluation (Signed)
Physical Therapy Evaluation Patient Details Name: Dylan Faulkner MRN: [id number] DOB: March 24, 1941 Today's Date: 07/22/2016   History of Present Illness  presented to ER secondary to SOB, hypoxia; admitted with acute hypoxic respiratory failure due to CHF exacerbation.  Clinical Impression  Upon evaluation, patient alert and oriented; follows all commands and demonstrates fair insight/safety awareness.  Bilat UE/LE strength and ROM grossly symmetrical and WFL for basic transfers and mobility; denies pain, focal weakness.  Mild reports of dizziness with transition to upright; appears to resolve with accommodation to position.  Mild drop in BP with initial transition; improved with sustained positioning.   Gait performance generally staggered and unsteady with significant decrease in step height/length and overall balance reactions without use of assist device, requiring constant min assist from therapist (50'); high fall risk appreciated.  Does improved to cga with use of RW (200') with noted improvement in gait mechanics, fluidity and overall safety.  Do recommend continued use of RW with all mobility at this time.  Patient voiced agreement/understanding. SaO2 stable and WFL (>95%) on RA at rest and with activity throughout session. Would benefit from skilled PT to address above deficits and promote optimal return to PLOF; Recommend transition to [place] upon discharge from acute hospitalization.     Follow Up Recommendations Home health PT    Equipment Recommendations  Rolling walker with 5" wheels (patient reports he has one at home)    Recommendations for Other Services       Precautions / Restrictions Precautions Precautions: Fall Restrictions Weight Bearing Restrictions: No      Mobility  Bed Mobility Overal bed mobility: Modified Independent                Transfers Overall transfer level: Needs assistance   Transfers: Sit to/from Stand Sit to Stand: Min guard             Ambulation/Gait Ambulation/Gait assistance: Min assist Ambulation Distance (Feet): 50 Feet Assistive device: None       General Gait Details: very short, shuffling (near festinating) gait pattern; staggered with very limited/delayed balance reactions.  High fall risk.  Stairs            Wheelchair Mobility    Modified Rankin (Stroke Patients Only)       Balance Overall balance assessment: Needs assistance Sitting-balance support: No upper extremity supported;Feet supported Sitting balance-Leahy Scale: Good     Standing balance support: No upper extremity supported Standing balance-Leahy Scale: Poor                               Pertinent Vitals/Pain Pain Assessment: No/denies pain    Home Living Family/patient expects to be discharged to:: Private residence Living Arrangements: Spouse/significant other Available Help at Discharge: Family Type of Home: House Home Access: Stairs to enter Entrance Stairs-Rails: Chemical engineer of Steps: 5 Home Layout: One level Home Equipment: Environmental consultant - 2 wheels      Prior Function Level of Independence: Independent         Comments: Indep with ADLs, household and community mobility; + driving.  Does endorse at least 1-2 falls in previous six months.     Hand Dominance        Extremity/Trunk Assessment   Upper Extremity Assessment Upper Extremity Assessment: Overall WFL for tasks assessed    Lower Extremity Assessment Lower Extremity Assessment: Overall WFL for tasks assessed (grossly at least 4+/5 throughout)  Communication   Communication: No difficulties  Cognition Arousal/Alertness: Awake/alert Behavior During Therapy: WFL for tasks assessed/performed Overall Cognitive Status: Within Functional Limits for tasks assessed                                        General Comments      Exercises Other Exercises Other Exercises: 150' with RW,  cga-improved step height/length, overall fluidity and symmetry with use of RW; patient voicing optimal comfort with RW.  Do recommend continued use with all mobility at this time; patient voiced understanding.   Assessment/Plan    PT Assessment Patient needs continued PT services  PT Problem List Decreased strength;Decreased activity tolerance;Decreased balance;Decreased mobility;Decreased coordination;Decreased knowledge of precautions;Decreased knowledge of use of DME;Decreased safety awareness;Cardiopulmonary status limiting activity       PT Treatment Interventions DME instruction;Gait training;Functional mobility training;Therapeutic activities;Therapeutic exercise;Balance training;Stair training;Patient/family education    PT Goals (Current goals can be found in the Care Plan section)  Acute Rehab PT Goals Patient Stated Goal: to return home PT Goal Formulation: With patient Time For Goal Achievement: 08/05/16 Potential to Achieve Goals: Good    Frequency Min 2X/week   Barriers to discharge Decreased caregiver support      Co-evaluation               End of Session Equipment Utilized During Treatment: Gait belt Activity Tolerance: Patient tolerated treatment well Patient left: in chair;with call bell/phone within reach;with chair alarm set Nurse Communication: Mobility status PT Visit Diagnosis: Unsteadiness on feet (R26.81)    Time: 6979-4801 PT Time Calculation (min) (ACUTE ONLY): 24 min   Charges:   PT Evaluation $PT Eval Low Complexity: 1 Procedure PT Treatments $Gait Training: 8-22 mins   PT G Codes:       Izic Stfort H. Owens Shark, PT, DPT, NCS 07/22/16, 10:34 AM [phone]

## 2016-08-02 ENCOUNTER — Ambulatory Visit: Payer: Medicare HMO | Admitting: Family

## 2016-08-02 ENCOUNTER — Telehealth: Payer: Self-pay | Admitting: Family

## 2016-08-02 NOTE — Telephone Encounter (Signed)
Patient missed his initial appointment at the [facility] 08/02/16. Will attempt to reschedule.

## 2016-08-05 ENCOUNTER — Ambulatory Visit: Payer: Medicare HMO | Admitting: Family

## 2016-08-05 ENCOUNTER — Telehealth: Payer: Self-pay | Admitting: Family

## 2016-08-05 NOTE — Telephone Encounter (Signed)
Patient missed his initial appointment at the [facility] on 08/05/16. Will attempt to reschedule.

## 2016-10-16 ENCOUNTER — Encounter: Payer: Self-pay | Admitting: Emergency Medicine

## 2016-10-16 ENCOUNTER — Emergency Department: Payer: Medicare HMO

## 2016-10-16 ENCOUNTER — Inpatient Hospital Stay
Admission: EM | Admit: 2016-10-16 | Discharge: 2016-10-20 | DRG: 682 | Disposition: A | Discharge status: Home Health | Payer: Medicare HMO | Attending: Internal Medicine | Admitting: Internal Medicine

## 2016-10-16 DIAGNOSIS — F0390 Unspecified dementia without behavioral disturbance: Secondary | ICD-10-CM | POA: Diagnosis present

## 2016-10-16 DIAGNOSIS — Z841 Family history of disorders of kidney and ureter: Secondary | ICD-10-CM

## 2016-10-16 DIAGNOSIS — N189 Chronic kidney disease, unspecified: Secondary | ICD-10-CM

## 2016-10-16 DIAGNOSIS — I130 Hypertensive heart and chronic kidney disease with heart failure and stage 1 through stage 4 chronic kidney disease, or unspecified chronic kidney disease: Secondary | ICD-10-CM | POA: Diagnosis present

## 2016-10-16 DIAGNOSIS — R609 Edema, unspecified: Secondary | ICD-10-CM

## 2016-10-16 DIAGNOSIS — R4781 Slurred speech: Secondary | ICD-10-CM | POA: Diagnosis present

## 2016-10-16 DIAGNOSIS — Z981 Arthrodesis status: Secondary | ICD-10-CM | POA: Diagnosis not present

## 2016-10-16 DIAGNOSIS — N400 Enlarged prostate without lower urinary tract symptoms: Secondary | ICD-10-CM | POA: Diagnosis present

## 2016-10-16 DIAGNOSIS — R410 Disorientation, unspecified: Secondary | ICD-10-CM

## 2016-10-16 DIAGNOSIS — I5043 Acute on chronic combined systolic (congestive) and diastolic (congestive) heart failure: Secondary | ICD-10-CM | POA: Diagnosis present

## 2016-10-16 DIAGNOSIS — G4730 Sleep apnea, unspecified: Secondary | ICD-10-CM | POA: Diagnosis present

## 2016-10-16 DIAGNOSIS — I5023 Acute on chronic systolic (congestive) heart failure: Secondary | ICD-10-CM

## 2016-10-16 DIAGNOSIS — Z8041 Family history of malignant neoplasm of ovary: Secondary | ICD-10-CM | POA: Diagnosis not present

## 2016-10-16 DIAGNOSIS — E8770 Fluid overload, unspecified: Secondary | ICD-10-CM | POA: Diagnosis not present

## 2016-10-16 DIAGNOSIS — I482 Chronic atrial fibrillation: Secondary | ICD-10-CM | POA: Diagnosis present

## 2016-10-16 DIAGNOSIS — I071 Rheumatic tricuspid insufficiency: Secondary | ICD-10-CM | POA: Diagnosis present

## 2016-10-16 DIAGNOSIS — D631 Anemia in chronic kidney disease: Secondary | ICD-10-CM | POA: Diagnosis present

## 2016-10-16 DIAGNOSIS — N179 Acute kidney failure, unspecified: Secondary | ICD-10-CM | POA: Diagnosis not present

## 2016-10-16 DIAGNOSIS — E785 Hyperlipidemia, unspecified: Secondary | ICD-10-CM | POA: Diagnosis present

## 2016-10-16 DIAGNOSIS — N183 Chronic kidney disease, stage 3 (moderate): Secondary | ICD-10-CM | POA: Diagnosis present

## 2016-10-16 DIAGNOSIS — Z9119 Patient's noncompliance with other medical treatment and regimen: Secondary | ICD-10-CM

## 2016-10-16 DIAGNOSIS — I272 Pulmonary hypertension, unspecified: Secondary | ICD-10-CM | POA: Diagnosis present

## 2016-10-16 DIAGNOSIS — Z7901 Long term (current) use of anticoagulants: Secondary | ICD-10-CM

## 2016-10-16 DIAGNOSIS — Z801 Family history of malignant neoplasm of trachea, bronchus and lung: Secondary | ICD-10-CM | POA: Diagnosis not present

## 2016-10-16 DIAGNOSIS — J45909 Unspecified asthma, uncomplicated: Secondary | ICD-10-CM | POA: Diagnosis present

## 2016-10-16 DIAGNOSIS — G9341 Metabolic encephalopathy: Secondary | ICD-10-CM | POA: Diagnosis present

## 2016-10-16 DIAGNOSIS — N289 Disorder of kidney and ureter, unspecified: Secondary | ICD-10-CM

## 2016-10-16 DIAGNOSIS — I4891 Unspecified atrial fibrillation: Secondary | ICD-10-CM

## 2016-10-16 LAB — BASIC METABOLIC PANEL
Anion gap: 10 (ref 5–15)
BUN: 20 mg/dL (ref 6–20)
CALCIUM: 8.4 mg/dL — AB (ref 8.9–10.3)
CO2: 24 mmol/L (ref 22–32)
Chloride: 104 mmol/L (ref 101–111)
Creatinine, Ser: 2.36 mg/dL — ABNORMAL HIGH (ref 0.61–1.24)
GFR calc Af Amer: 30 mL/min — ABNORMAL LOW (ref >60)
GFR, EST NON AFRICAN AMERICAN: 26 mL/min — AB (ref >60)
GLUCOSE: 97 mg/dL (ref 65–99)
Potassium: 3.6 mmol/L (ref 3.5–5.1)
Sodium: 138 mmol/L (ref 135–145)

## 2016-10-16 LAB — AMMONIA: Ammonia: 29 umol/L (ref 9–35)

## 2016-10-16 LAB — CBC WITH DIFFERENTIAL/PLATELET
BASOS ABS: 0.2 10*3/uL — AB (ref 0–0.1)
Basophils Relative: 2 %
EOS ABS: 0.1 10*3/uL (ref 0–0.7)
Eosinophils Relative: 2 %
HCT: 30.5 % — ABNORMAL LOW (ref 40.0–52.0)
Hemoglobin: 10.5 g/dL — ABNORMAL LOW (ref 13.0–18.0)
LYMPHS ABS: 0.8 10*3/uL — AB (ref 1.0–3.6)
LYMPHS PCT: 12 %
MCH: 34.1 pg — AB (ref 26.0–34.0)
MCHC: 34.5 g/dL (ref 32.0–36.0)
MCV: 99 fL (ref 80.0–100.0)
MONO ABS: 1.2 10*3/uL — AB (ref 0.2–1.0)
Monocytes Relative: 18 %
Neutro Abs: 4.4 10*3/uL (ref 1.4–6.5)
Neutrophils Relative %: 66 %
PLATELETS: 204 10*3/uL (ref 150–440)
RBC: 3.08 MIL/uL — ABNORMAL LOW (ref 4.40–5.90)
RDW: 14.9 % — AB (ref 11.5–14.5)
WBC: 6.7 10*3/uL (ref 3.8–10.6)

## 2016-10-16 LAB — PROTIME-INR
INR: 2.86
Prothrombin Time: 30.6 seconds — ABNORMAL HIGH (ref 11.4–15.2)

## 2016-10-16 LAB — HEPATIC FUNCTION PANEL
ALBUMIN: 3.2 g/dL — AB (ref 3.5–5.0)
ALT: 20 U/L (ref 17–63)
AST: 32 U/L (ref 15–41)
Alkaline Phosphatase: 51 U/L (ref 38–126)
Bilirubin, Direct: 0.1 mg/dL (ref 0.1–0.5)
Indirect Bilirubin: 0.7 mg/dL (ref 0.3–0.9)
TOTAL PROTEIN: 5.9 g/dL — AB (ref 6.5–8.1)
Total Bilirubin: 0.8 mg/dL (ref 0.3–1.2)

## 2016-10-16 LAB — TROPONIN I
Troponin I: 0.04 ng/mL (ref <0.03)
Troponin I: 0.04 ng/mL (ref <0.03)

## 2016-10-16 LAB — BRAIN NATRIURETIC PEPTIDE: B NATRIURETIC PEPTIDE 5: 393 pg/mL — AB (ref 0.0–100.0)

## 2016-10-16 MED ORDER — ISOSORBIDE MONONITRATE ER 30 MG PO TB24
30.0000 mg | ORAL_TABLET | Freq: Every day | ORAL | Status: DC
  Administered 2016-10-17 – 2016-10-20 (×4): 30 mg via ORAL
  Filled 2016-10-16 (×4): qty 1

## 2016-10-16 MED ORDER — LORATADINE 10 MG PO TABS
10.0000 mg | ORAL_TABLET | Freq: Every day | ORAL | Status: DC
  Administered 2016-10-17 – 2016-10-20 (×4): 10 mg via ORAL
  Filled 2016-10-16 (×4): qty 1

## 2016-10-16 MED ORDER — POTASSIUM CHLORIDE CRYS ER 20 MEQ PO TBCR: 20.0000 meq | EXTENDED_RELEASE_TABLET | Freq: Every day | ORAL | Status: DC

## 2016-10-16 MED ORDER — FUROSEMIDE 10 MG/ML IJ SOLN
80.0000 mg | Freq: Once | INTRAMUSCULAR | Status: AC
  Administered 2016-10-16: 80 mg via INTRAVENOUS
  Filled 2016-10-16: qty 8

## 2016-10-16 MED ORDER — SODIUM CHLORIDE 0.9 % IV SOLN
INTRAVENOUS | Status: DC
  Administered 2016-10-16: 21:00:00 via INTRAVENOUS

## 2016-10-16 MED ORDER — WARFARIN SODIUM 2.5 MG PO TABS: 2.5000 mg | ORAL_TABLET | Freq: Every day | ORAL | Status: DC

## 2016-10-16 MED ORDER — SIMVASTATIN 20 MG PO TABS
20.0000 mg | ORAL_TABLET | Freq: Every day | ORAL | Status: DC
  Administered 2016-10-17 – 2016-10-20 (×4): 20 mg via ORAL
  Filled 2016-10-16 (×4): qty 1

## 2016-10-16 MED ORDER — MONTELUKAST SODIUM 10 MG PO TABS
10.0000 mg | ORAL_TABLET | Freq: Every day | ORAL | Status: DC
  Administered 2016-10-17 – 2016-10-20 (×4): 10 mg via ORAL
  Filled 2016-10-16 (×4): qty 1

## 2016-10-16 MED ORDER — CARVEDILOL 3.125 MG PO TABS
3.1250 mg | ORAL_TABLET | Freq: Two times a day (BID) | ORAL | Status: DC
  Administered 2016-10-17 – 2016-10-18 (×3): 3.125 mg via ORAL
  Filled 2016-10-16 (×3): qty 1

## 2016-10-16 MED ORDER — SERTRALINE HCL 50 MG PO TABS
50.0000 mg | ORAL_TABLET | Freq: Every day | ORAL | Status: DC
  Administered 2016-10-16 – 2016-10-19 (×4): 50 mg via ORAL
  Filled 2016-10-16 (×4): qty 1

## 2016-10-16 MED ORDER — HYDRALAZINE HCL 25 MG PO TABS
25.0000 mg | ORAL_TABLET | Freq: Three times a day (TID) | ORAL | Status: DC
  Administered 2016-10-16 – 2016-10-20 (×12): 25 mg via ORAL
  Filled 2016-10-16 (×12): qty 1

## 2016-10-16 MED ORDER — WARFARIN - PHYSICIAN DOSING INPATIENT
Freq: Every day | Status: DC
  Administered 2016-10-18: 18:00:00

## 2016-10-16 MED ORDER — POTASSIUM CHLORIDE ER 20 MEQ PO TBCR: 20.0000 meq | EXTENDED_RELEASE_TABLET | Freq: Every day | ORAL | Status: DC

## 2016-10-16 MED ORDER — METOPROLOL TARTRATE 5 MG/5ML IV SOLN
5.0000 mg | Freq: Once | INTRAVENOUS | Status: AC
  Administered 2016-10-16: 5 mg via INTRAVENOUS
  Filled 2016-10-16: qty 5

## 2016-10-16 MED ORDER — FLUTICASONE PROPIONATE 50 MCG/ACT NA SUSP
2.0000 | Freq: Every day | NASAL | Status: DC | PRN
  Filled 2016-10-16: qty 16

## 2016-10-16 MED ORDER — PANTOPRAZOLE SODIUM 40 MG PO TBEC
40.0000 mg | DELAYED_RELEASE_TABLET | Freq: Every day | ORAL | Status: DC
  Administered 2016-10-17 – 2016-10-20 (×4): 40 mg via ORAL
  Filled 2016-10-16 (×4): qty 1

## 2016-10-16 MED ORDER — ALBUTEROL SULFATE HFA 108 (90 BASE) MCG/ACT IN AERS: 2.0000 | INHALATION_SPRAY | Freq: Four times a day (QID) | RESPIRATORY_TRACT | Status: DC | PRN

## 2016-10-16 MED ORDER — VITAMIN B-6 50 MG PO TABS
50.0000 mg | ORAL_TABLET | Freq: Every day | ORAL | Status: DC
  Administered 2016-10-17 – 2016-10-20 (×4): 50 mg via ORAL
  Filled 2016-10-16 (×4): qty 1

## 2016-10-16 MED ORDER — WARFARIN SODIUM 5 MG PO TABS
5.0000 mg | ORAL_TABLET | ORAL | Status: DC
  Administered 2016-10-18 – 2016-10-20 (×3): 5 mg via ORAL
  Filled 2016-10-16 (×3): qty 1

## 2016-10-16 MED ORDER — VITAMIN B-12 1000 MCG PO TABS
500.0000 ug | ORAL_TABLET | Freq: Every day | ORAL | Status: DC
  Administered 2016-10-17 – 2016-10-20 (×4): 500 ug via ORAL
  Filled 2016-10-16 (×4): qty 1

## 2016-10-16 MED ORDER — TAMSULOSIN HCL 0.4 MG PO CAPS
0.4000 mg | ORAL_CAPSULE | Freq: Every day | ORAL | Status: DC
  Administered 2016-10-17 – 2016-10-20 (×4): 0.4 mg via ORAL
  Filled 2016-10-16 (×4): qty 1

## 2016-10-16 MED ORDER — ACETAMINOPHEN 500 MG PO TABS
1000.0000 mg | ORAL_TABLET | Freq: Four times a day (QID) | ORAL | Status: DC | PRN
  Administered 2016-10-17 – 2016-10-20 (×4): 1000 mg via ORAL
  Filled 2016-10-16 (×4): qty 2

## 2016-10-16 MED ORDER — NITROGLYCERIN 0.4 MG SL SUBL: 0.4000 mg | SUBLINGUAL_TABLET | SUBLINGUAL | Status: DC | PRN

## 2016-10-16 MED ORDER — ADULT MULTIVITAMIN W/MINERALS CH
1.0000 | ORAL_TABLET | Freq: Every day | ORAL | Status: DC
  Administered 2016-10-17 – 2016-10-20 (×4): 1 via ORAL
  Filled 2016-10-16 (×4): qty 1

## 2016-10-16 MED ORDER — MOMETASONE FURO-FORMOTEROL FUM 200-5 MCG/ACT IN AERO
2.0000 | INHALATION_SPRAY | Freq: Two times a day (BID) | RESPIRATORY_TRACT | Status: DC
  Administered 2016-10-16 – 2016-10-20 (×8): 2 via RESPIRATORY_TRACT
  Filled 2016-10-16: qty 8.8

## 2016-10-16 MED ORDER — ALBUTEROL SULFATE (2.5 MG/3ML) 0.083% IN NEBU: 2.5000 mg | INHALATION_SOLUTION | Freq: Four times a day (QID) | RESPIRATORY_TRACT | Status: DC | PRN

## 2016-10-16 MED ORDER — WARFARIN SODIUM 2.5 MG PO TABS
2.5000 mg | ORAL_TABLET | ORAL | Status: DC
  Administered 2016-10-16 – 2016-10-17 (×2): 2.5 mg via ORAL
  Filled 2016-10-16 (×2): qty 1

## 2016-10-16 MED ORDER — FOLIC ACID 1 MG PO TABS
1.0000 mg | ORAL_TABLET | Freq: Every day | ORAL | Status: DC
Start: 2016-10-17 — End: 2016-10-21
  Administered 2016-10-17 – 2016-10-20 (×4): 1 mg via ORAL
  Filled 2016-10-16 (×4): qty 1

## 2016-10-16 MED ORDER — MECLIZINE HCL 25 MG PO TABS
25.0000 mg | ORAL_TABLET | Freq: Three times a day (TID) | ORAL | Status: DC | PRN
  Administered 2016-10-18: 25 mg via ORAL
  Filled 2016-10-16 (×2): qty 1

## 2016-10-16 MED ORDER — FINASTERIDE 5 MG PO TABS
5.0000 mg | ORAL_TABLET | Freq: Every day | ORAL | Status: DC
  Administered 2016-10-17 – 2016-10-20 (×4): 5 mg via ORAL
  Filled 2016-10-16 (×4): qty 1

## 2016-10-16 MED ORDER — GABAPENTIN 600 MG PO TABS
600.0000 mg | ORAL_TABLET | Freq: Three times a day (TID) | ORAL | Status: DC
  Administered 2016-10-16 – 2016-10-17 (×2): 600 mg via ORAL
  Filled 2016-10-16 (×2): qty 1

## 2016-10-16 NOTE — ED Notes (Signed)
Patient transported to X-ray 

## 2016-10-16 NOTE — H&P (Signed)
Dylan Faulkner is an 76 y.o. male.   Chief Complaint: Lower extremity edema HPI: This is 76 year old male who has a history of congestive heart failure and atrial fibrillation. He is a poor historian because of confusion. His wife said is been getting more confused over the past few days and hallucinating. She also says he's been having increased lower extremity edema and abdominal girth. He has been to see his primary care doctor a few weeks ago according to the wife had a stroke some mild lowered to twice a day because of his renal function. However the wife started getting him dressed mild 3 times a day for the past week trying to improve his lower extremity edema. She says also he's been out in the hot sun and she thought he was dehydrated and try to get him to drink more. She also noted that she thought his skin was getting yellow.  Past Medical History:  Diagnosis Date  . A-fib ([place])   . Asthma   . Atrial fibrillation ([place])   . BPH (benign prostatic hyperplasia)   . CHF (congestive heart failure) ([place])   . Enlarged prostate   . Heart failure ([place])   . Hyperlipidemia   . Hypertension   . 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 Renal insufficiency   . Vertigo     Past Surgical History:  Procedure Laterality Date  . BICEPS TENDON REPAIR    . HERNIA REPAIR    . neck fusion      Family History  Problem Relation Age of Onset  . Hypertension Unknown   . Dementia Mother   . Kidney failure Father   . Ovarian cancer Sister   . Lung cancer Brother    Social History:  reports that he has never smoked. He has never used smokeless tobacco. He reports that he does not drink alcohol or use drugs.  Allergies: No Known Allergies   (Not in a hospital admission)  Results for orders placed or performed during the hospital encounter of 10/16/16 (from the past 48 hour(s))  CBC with Differential     Status: Abnormal   Collection Time: 10/16/16  3:43 PM  Result Value Ref Range   WBC 6.7 3.8 -  10.6 K/uL   RBC 3.08 (L) 4.40 - 5.90 MIL/uL   Hemoglobin 10.5 (L) 13.0 - 18.0 g/dL   HCT 30.5 (L) 40.0 - 52.0 %   MCV 99.0 80.0 - 100.0 fL   MCH 34.1 (H) 26.0 - 34.0 pg   MCHC 34.5 32.0 - 36.0 g/dL   RDW 14.9 (H) 11.5 - 14.5 %   Platelets 204 150 - 440 K/uL   Neutrophils Relative % 66 %   Neutro Abs 4.4 1.4 - 6.5 K/uL   Lymphocytes Relative 12 %   Lymphs Abs 0.8 (L) 1.0 - 3.6 K/uL   Monocytes Relative 18 %   Monocytes Absolute 1.2 (H) 0.2 - 1.0 K/uL   Eosinophils Relative 2 %   Eosinophils Absolute 0.1 0 - 0.7 K/uL   Basophils Relative 2 %   Basophils Absolute 0.2 (H) 0 - 0.1 K/uL  Basic metabolic panel     Status: Abnormal   Collection Time: 10/16/16  3:43 PM  Result Value Ref Range   Sodium 138 135 - 145 mmol/L   Potassium 3.6 3.5 - 5.1 mmol/L   Chloride 104 101 - 111 mmol/L   CO2 24 22 - 32 mmol/L   Glucose, Bld 97 65 -  99 mg/dL   BUN 20 6 - 20 mg/dL   Creatinine, Ser 2.36 (H) 0.61 - 1.24 mg/dL   Calcium 8.4 (L) 8.9 - 10.3 mg/dL   GFR calc non Af Amer 26 (L) >60 mL/min   GFR calc Af Amer 30 (L) >60 mL/min    Comment: (NOTE) The eGFR has been calculated using the CKD EPI equation. This calculation has not been validated in all clinical situations. eGFR's persistently <60 mL/min signify possible Chronic Kidney Disease.    Anion gap 10 5 - 15  Brain natriuretic peptide     Status: Abnormal   Collection Time: 10/16/16  3:43 PM  Result Value Ref Range   B Natriuretic Peptide 393.0 (H) 0.0 - 100.0 pg/mL  Troponin I     Status: Abnormal   Collection Time: 10/16/16  3:43 PM  Result Value Ref Range   Troponin I 0.04 (HH) <0.03 ng/mL    Comment: CRITICAL RESULT CALLED TO, READ BACK BY AND VERIFIED WITH Dylan Faulkner ON 10/16/16 AT 1635 [facility]   Protime-INR     Status: Abnormal   Collection Time: 10/16/16  3:43 PM  Result Value Ref Range   Prothrombin Time 30.6 (H) 11.4 - 15.2 seconds   INR 2.86   Hepatic function panel     Status: Abnormal   Collection Time: 10/16/16  3:43  PM  Result Value Ref Range   Total Protein 5.9 (L) 6.5 - 8.1 g/dL   Albumin 3.2 (L) 3.5 - 5.0 g/dL   AST 32 15 - 41 U/L   ALT 20 17 - 63 U/L   Alkaline Phosphatase 51 38 - 126 U/L   Total Bilirubin 0.8 0.3 - 1.2 mg/dL   Bilirubin, Direct 0.1 0.1 - 0.5 mg/dL   Indirect Bilirubin 0.7 0.3 - 0.9 mg/dL   Dg Chest 2 View  Result Date: 10/16/2016 CLINICAL DATA:  10 pound weight gain over the past week. Clinical concern for fluid retention. Ascites and pedal edema. EXAM: CHEST  2 VIEW COMPARISON:  07/20/2016. FINDINGS: Decreased depth of inspiration with increased elevation of the left hemidiaphragm. Increased linear density in the left mid lung zone with interval linear density in the left lower lung zone. Clear right lung. Enlarged cardiac silhouette without significant change. Cervical spine fixation hardware. IMPRESSION: 1. Interval mild left mid and lower lung zone atelectasis with decreased lung volume on the left and increased elevation of the left hemidiaphragm. 2. Stable cardiomegaly. Electronically Signed   By: Claudie Revering M.D.   On: 10/16/2016 16:11    Review of Systems  Constitutional: Negative for fever.  HENT: Negative for hearing loss.   Eyes: Negative for blurred vision.  Respiratory: Negative for shortness of breath.   Cardiovascular: Negative for chest pain.  Gastrointestinal: Negative for nausea and vomiting.  Genitourinary: Negative for dysuria.  Musculoskeletal: Positive for back pain.  Skin: Negative for rash.  Neurological: Negative for dizziness.    Blood pressure 132/81, pulse 85, temperature 99.8 F (37.7 C), temperature source Oral, resp. rate (!) 22, weight 85.1 kg (187 lb 9.8 oz), SpO2 98 %. Physical Exam  Constitutional: He is oriented to person, place, and time. He appears well-developed and well-nourished. No distress.  HENT:  Head: Normocephalic.  Oral mucosa dry. Mild scleral icterus  Eyes: Pupils are equal, round, and reactive to light. Scleral icterus  is present.  Neck: Neck supple. No JVD present. No tracheal deviation present. No thyromegaly present.  Cardiovascular:  Irregularly irregular. No murmurs  Respiratory: Effort  normal and breath sounds normal. No respiratory distress. He exhibits no tenderness.  GI: Soft. Bowel sounds are normal. He exhibits distension. He exhibits no mass.  Musculoskeletal: Normal range of motion. He exhibits edema.  Lymphadenopathy:    He has no cervical adenopathy.  Neurological: He is alert and oriented to person, place, and time.  Skin: Skin is warm and dry.     Assessment/Plan 1. Acute renal failure. Patient does have stage III chronic kidney disease however his creatinine is somewhat elevated here today. Suspect this is secondary to over diuresis and probably some dehydration. We'll hold his to Rocephin my. Since his chest x-ray does not show any pulmonary edema will go ahead and give him back some IV fluid gently. We'll also hold his lisinopril due to renal function. He is on hydralazine and Imdur. 2. Atrial fibrillation with rapid ventricular response. Again some medication here in the ER and his rate is now below 100. Suspect this may be due from intervascular depletion. We'll monitor this. 3. Anasarca. He has both lower extremity edema and increased abdominal girth. May even be ascites. Spleen secondary to right-sided heart failure or he may have developed some liver disease. Going check his LFTs. If they are elevated will do a liver workup. We'll keep feet elevated. 4. Encephalopathy. Patient's been confused placement. Going check an ammonia level. Wife says he gets this way when he gets into congestive heart failure or any other illness. We'll monitor for improvement as he is treated. X on a son total time spent 45 minutes  Baxter Hire, MD 10/16/2016, 5:47 PM

## 2016-10-16 NOTE — ED Provider Notes (Signed)
[facility] Emergency Department Provider Note       Time seen: ----------------------------------------- 3:36 PM on 10/16/2016 -----------------------------------------     I have reviewed the triage vital signs and the nursing notes.   HISTORY   Chief Complaint No chief complaint on file.    HPI Dylan Faulkner is a 76 y.o. male who presents to the ED for fluid retention. Patient has not had fevers, chills, chest pain but has had mild shortness of breath. He reports a 10 pound weight gain over the last week. He reports taking his diuretics as prescribed but states he is not very consistent with his heart failure diet. He sees Dr. Clayborn Bigness for cardiology.   Past Medical History:  Diagnosis Date  . A-fib ([place])   . Asthma   . Atrial fibrillation ([place])   . BPH (benign prostatic hyperplasia)   . CHF (congestive heart failure) ([place])   . Enlarged prostate   . Heart failure ([place])   . Hyperlipidemia   . Hypertension   . 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 Renal insufficiency   . Vertigo     Patient Active Problem List   Diagnosis Date Noted  . Syncope 05/16/2015    Past Surgical History:  Procedure Laterality Date  . BICEPS TENDON REPAIR    . HERNIA REPAIR    . neck fusion      Allergies Patient has no known allergies.  Social History Social History  Substance Use Topics  . Smoking status: Never Smoker  . Smokeless tobacco: Never Used  . Alcohol use No    Review of Systems Constitutional: Negative for fever. Cardiovascular: Negative for chest pain. Respiratory: Positive for mild shortness of breath Gastrointestinal: Negative for abdominal pain, vomiting and diarrhea. Genitourinary: Negative for dysuria. Musculoskeletal: Negative for back pain. Positive for edema Skin: Negative for rash. Neurological: Negative for headaches, focal weakness or numbness.  All systems negative/normal/unremarkable except as stated in the  HPI  ____________________________________________   PHYSICAL EXAM:  VITAL SIGNS: ED Triage Vitals  Enc Vitals Group     BP      Pulse      Resp      Temp      Temp src      SpO2      Weight      Height      Head Circumference      Peak Flow      Pain Score      Pain Loc      Pain Edu?      Excl. in [place]?     Constitutional: Alert and oriented. Well appearing and in no distress. Eyes: Conjunctivae are normal. Normal extraocular movements. ENT   Head: Normocephalic and atraumatic.   Nose: No congestion/rhinnorhea.   Mouth/Throat: Mucous membranes are moist.   Neck: No stridor. Cardiovascular: Normal rate, regular rhythm. No murmurs, rubs, or gallops. Respiratory: Normal respiratory effort without tachypnea nor retractions. Breath sounds are clear and equal bilaterally. No wheezes/rales/rhonchi. Gastrointestinal: Soft and nontender. Normal bowel sounds, distention is noted Musculoskeletal: Nontender with normal range of motion in extremities. Pitting edema is noted below the knees bilaterally Neurologic:  Normal speech and language. No gross focal neurologic deficits are appreciated.  Skin:  Skin is warm, dry and intact. No rash noted. Psychiatric: Mood and affect are normal. Speech and behavior are normal.  ____________________________________________  EKG: Interpreted by me. Atrial fibrillation with a rapid ventricular response, rate is 119  bpm, normal QRS, normal QT, likely anterior infarct age indeterminate  ____________________________________________  ED COURSE:  Pertinent labs & imaging results that were available during my care of the patient were reviewed by me and considered in my medical decision making (see chart for details). Patient presents for edema, we will assess with labs and imaging as indicated.   Procedures ____________________________________________   LABS (pertinent positives/negatives)  Labs Reviewed  CBC WITH  DIFFERENTIAL/PLATELET - Abnormal; Notable for the following:       Result Value   RBC 3.08 (*)    Hemoglobin 10.5 (*)    HCT 30.5 (*)    MCH 34.1 (*)    RDW 14.9 (*)    Lymphs Abs 0.8 (*)    Monocytes Absolute 1.2 (*)    Basophils Absolute 0.2 (*)    All other components within normal limits  BASIC METABOLIC PANEL - Abnormal; Notable for the following:    Creatinine, Ser 2.36 (*)    Calcium 8.4 (*)    GFR calc non Af Amer 26 (*)    GFR calc Af Amer 30 (*)    All other components within normal limits  BRAIN NATRIURETIC PEPTIDE - Abnormal; Notable for the following:    B Natriuretic Peptide 393.0 (*)    All other components within normal limits  TROPONIN I - Abnormal; Notable for the following:    Troponin I 0.04 (*)    All other components within normal limits  PROTIME-INR - Abnormal; Notable for the following:    Prothrombin Time 30.6 (*)    All other components within normal limits   CRITICAL CARE Performed by: Earleen Newport   Total critical care time: 30 minutes  Critical care time was exclusive of separately billable procedures and treating other patients.  Critical care was necessary to treat or prevent imminent or life-threatening deterioration.  Critical care was time spent personally by me on the following activities: development of treatment plan with patient and/or surrogate as well as nursing, discussions with consultants, evaluation of patient's response to treatment, examination of patient, obtaining history from patient or surrogate, ordering and performing treatments and interventions, ordering and review of laboratory studies, ordering and review of radiographic studies, pulse oximetry and re-evaluation of patient's condition.  RADIOLOGY Images were viewed by me  Chest x-ray IMPRESSION: 1. Interval mild left mid and lower lung zone atelectasis with decreased lung volume on the left and increased elevation of the left hemidiaphragm. 2. Stable  cardiomegaly. ____________________________________________  FINAL ASSESSMENT AND PLAN  Peripheral edema, CHF, Acute on chronic renal insufficiency, chronically elevated troponin, atrial fibrillation with a rapid ventricular response  Plan: Patient's labs and imaging were dictated above. Patient had presented for edema and fluid retention with 10 pound weight gain. His kidney function has worsened somewhat and he has not diuresed well despite 80 mg IV Lasix. He also received IV Lopressor for rapid atrial fibrillation which is also chronic. Given his findings he would likely be beneficial to admit to the hospital for further evaluation of heart failure and renal insufficiency.   Earleen Newport, MD   Note: This note was generated in part or whole with voice recognition software. Voice recognition is usually quite accurate but there are transcription errors that can and very often do occur. I apologize for any typographical errors that were not detected and corrected.     Earleen Newport, MD 10/16/16 [phone]

## 2016-10-16 NOTE — ED Triage Notes (Signed)
Pt to ED via [facility] from home for fluid retention. Pt has had 10 pound weight gain over the last week. Pt has hx/o CHF and dementia. Pt has ascites and pedal edema.

## 2016-10-17 LAB — COMPREHENSIVE METABOLIC PANEL
ALBUMIN: 3.2 g/dL — AB (ref 3.5–5.0)
ALT: 20 U/L (ref 17–63)
AST: 25 U/L (ref 15–41)
Alkaline Phosphatase: 64 U/L (ref 38–126)
Anion gap: 8 (ref 5–15)
BILIRUBIN TOTAL: 0.8 mg/dL (ref 0.3–1.2)
BUN: 24 mg/dL — AB (ref 6–20)
CHLORIDE: 102 mmol/L (ref 101–111)
CO2: 28 mmol/L (ref 22–32)
CREATININE: 1.95 mg/dL — AB (ref 0.61–1.24)
Calcium: 8.4 mg/dL — ABNORMAL LOW (ref 8.9–10.3)
GFR calc Af Amer: 37 mL/min — ABNORMAL LOW (ref >60)
GFR calc non Af Amer: 32 mL/min — ABNORMAL LOW (ref >60)
GLUCOSE: 120 mg/dL — AB (ref 65–99)
POTASSIUM: 3.3 mmol/L — AB (ref 3.5–5.1)
Sodium: 138 mmol/L (ref 135–145)
Total Protein: 6 g/dL — ABNORMAL LOW (ref 6.5–8.1)

## 2016-10-17 LAB — MAGNESIUM: MAGNESIUM: 1.2 mg/dL — AB (ref 1.7–2.4)

## 2016-10-17 LAB — PROTIME-INR
INR: 2.85
PROTHROMBIN TIME: 30.5 s — AB (ref 11.4–15.2)

## 2016-10-17 LAB — TROPONIN I
Troponin I: 0.04 ng/mL (ref <0.03)
Troponin I: 0.04 ng/mL (ref <0.03)

## 2016-10-17 MED ORDER — HALOPERIDOL LACTATE 5 MG/ML IJ SOLN
2.0000 mg | Freq: Four times a day (QID) | INTRAMUSCULAR | Status: DC | PRN
  Administered 2016-10-17 – 2016-10-20 (×3): 2 mg via INTRAVENOUS
  Filled 2016-10-17 (×3): qty 1

## 2016-10-17 MED ORDER — TORSEMIDE 20 MG PO TABS
40.0000 mg | ORAL_TABLET | Freq: Two times a day (BID) | ORAL | Status: DC
  Administered 2016-10-17 – 2016-10-18 (×3): 40 mg via ORAL
  Filled 2016-10-17 (×3): qty 2

## 2016-10-17 MED ORDER — POTASSIUM CHLORIDE CRYS ER 20 MEQ PO TBCR
40.0000 meq | EXTENDED_RELEASE_TABLET | ORAL | Status: AC
  Administered 2016-10-17 (×2): 40 meq via ORAL
  Filled 2016-10-17 (×2): qty 2

## 2016-10-17 MED ORDER — MAGNESIUM OXIDE 400 (241.3 MG) MG PO TABS
400.0000 mg | ORAL_TABLET | Freq: Two times a day (BID) | ORAL | Status: AC
  Administered 2016-10-17 (×2): 400 mg via ORAL
  Filled 2016-10-17 (×2): qty 1

## 2016-10-17 MED ORDER — DILTIAZEM HCL 25 MG/5ML IV SOLN: 5.0000 mg | Freq: Once | INTRAVENOUS | Status: DC

## 2016-10-17 MED ORDER — METOPROLOL TARTRATE 5 MG/5ML IV SOLN
5.0000 mg | Freq: Once | INTRAVENOUS | Status: AC
  Administered 2016-10-17: 5 mg via INTRAVENOUS
  Filled 2016-10-17: qty 5

## 2016-10-17 MED ORDER — GABAPENTIN 600 MG PO TABS
300.0000 mg | ORAL_TABLET | Freq: Three times a day (TID) | ORAL | Status: DC
  Administered 2016-10-17 – 2016-10-18 (×3): 300 mg via ORAL
  Filled 2016-10-17 (×3): qty 1

## 2016-10-17 MED ORDER — CYCLOBENZAPRINE HCL 10 MG PO TABS
10.0000 mg | ORAL_TABLET | Freq: Three times a day (TID) | ORAL | Status: DC | PRN
  Administered 2016-10-17: 10 mg via ORAL
  Filled 2016-10-17: qty 1

## 2016-10-17 MED ORDER — DICLOFENAC SODIUM 1 % TD GEL
2.0000 g | Freq: Four times a day (QID) | TRANSDERMAL | Status: DC | PRN
  Administered 2016-10-17 – 2016-10-20 (×3): 2 g via TOPICAL
  Filled 2016-10-17 (×2): qty 100

## 2016-10-17 NOTE — Progress Notes (Signed)
Per CCMD pt had 25 beat of V-tach, pt. Alert and oriented with no signs or c/o pain or distress observed. Prime paged, awaiting call back.

## 2016-10-17 NOTE — Progress Notes (Signed)
Pt. Slept throughout the night awakening only to use urinal. No signs or c/o pain, distress or SOB observed.

## 2016-10-17 NOTE — Consult Note (Signed)
Date: 10/17/2016                  Patient Name:  Dylan Faulkner  MRN: [id number]  DOB: [birth year]  Age / Sex: 76 y.o., male         PCP: Donnie Coffin, MD                 Service Requesting Consult: hospitalist/ Gladstone Lighter, MD                 Reason for Consult: ARF            History of Present Illness: Patient is a 76 y.o. 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   According to his wife, he was taking torsemide 20 mg as prescribed. In fact she was giving him an extra pill daily for a total of 20 mg 3 times a day but his swelling kept getting worse. Therefore she brought in for evaluation.  His baseline creatinine appears to be 1.36 from 07/20/2016. GFR 50 This time, presenting creatinine is 2.36. Today's creatinine has improved slightly to 1.95. He was given IV fluids yesterday along with Lasix 80 mg IV yesterday. His wife reports that lower extremity edema is improved somewhat. She also reports that he is not compliant with low-salt diet.   Medications: Outpatient medications: Prescriptions Prior to Admission  Medication Sig Dispense Refill Last Dose  . acetaminophen (TYLENOL) 500 MG tablet Take 1,000 mg by mouth every 6 (six) hours as needed.   prn at prn  . albuterol (PROVENTIL HFA;VENTOLIN HFA) 108 (90 Base) MCG/ACT inhaler Inhale 2 puffs into the lungs every 6 (six) hours as needed for wheezing or shortness of breath. 1 Inhaler 2 prn at prn  . carvedilol (COREG) 3.125 MG tablet Take 3.125 mg by mouth 2 (two) times daily with a meal.   10/16/2016 at am  . cyanocobalamin 500 MCG tablet Take 500 mcg by mouth daily.   10/16/2016 at am  . cyclobenzaprine (FLEXERIL) 10 MG tablet Take 10 mg by mouth 3 (three) times daily  as needed.    prn at prn  . finasteride (PROSCAR) 5 MG tablet Take 5 mg by mouth daily.   10/16/2016 at am  . fluticasone (FLONASE) 50 MCG/ACT nasal spray Place 2 sprays into both nostrils daily as needed for allergies or rhinitis.   prn at prn  . Fluticasone-Salmeterol (ADVAIR) 250-50 MCG/DOSE AEPB Inhale 1 puff into the lungs 2 (two) times daily.   08/06/5463 at am  . folic acid (FOLVITE) 1 MG tablet Take 1 mg by mouth daily.   10/16/2016 at am  . gabapentin (NEURONTIN) 600 MG tablet Take 1 tablet (600 mg total) by mouth 3 (three) times daily. 90 tablet 0 10/16/2016 at am  . hydrALAZINE (APRESOLINE) 25 MG tablet Take 25 mg by mouth 3 (three) times daily.   10/16/2016 at am  . isosorbide mononitrate (IMDUR) 30 MG 24 hr tablet Take 1 tablet (30 mg total) by mouth daily. 30 tablet 0 10/16/2016 at am  . lisinopril (PRINIVIL,ZESTRIL) 2.5 MG tablet Take 2.5 mg by mouth daily.    10/16/2016 at am  . loratadine (CLARITIN) 10 MG tablet Take 10 mg by mouth daily.   10/16/2016 at am  . meclizine (  ANTIVERT) 25 MG tablet Take 25 mg by mouth 3 (three) times daily as needed for dizziness.   prn at prn  . montelukast (SINGULAIR) 10 MG tablet Take 10 mg by mouth daily.   10/16/2016 at AM  . Multiple Vitamin (MULTIVITAMIN WITH MINERALS) TABS tablet Take 1 tablet by mouth daily.   10/16/2016 at AM  . nitroGLYCERIN (NITROSTAT) 0.4 MG SL tablet Place 0.4 mg under the tongue every 5 (five) minutes as needed for chest pain.   PRN at PRN  . omeprazole (PRILOSEC) 20 MG capsule Take 20 mg by mouth daily.   10/16/2016 at AM  . Potassium Chloride ER 20 MEQ TBCR Take 20 mEq by mouth daily.    10/16/2016 at am  . pyridOXINE (VITAMIN B-6) 50 MG tablet Take 50 mg by mouth daily.   10/16/2016 at am  . sertraline (ZOLOFT) 50 MG tablet Take 50 mg by mouth at bedtime.    10/15/2016 at qhs   . simvastatin (ZOCOR) 20 MG tablet Take 20 mg by mouth daily at 6 PM.    10/15/2016 at qpm  . tamsulosin (FLOMAX) 0.4 MG CAPS capsule Take 0.4 mg by mouth  daily.    10/16/2016 at am  . torsemide (DEMADEX) 20 MG tablet Take 20 mg by mouth 3 (three) times daily.   10/16/2016 at am  . traMADol (ULTRAM) 50 MG tablet Take by mouth every 6 (six) hours as needed.   prn at prn  . warfarin (COUMADIN) 5 MG tablet Take 2.5-5 mg by mouth daily. Take 1 tab Monday-Thursday and 0.5 tab Friday-Sunday   10/16/2016 at am  . furosemide (LASIX) 20 MG tablet Take 1 tablet (20 mg total) by mouth 2 (two) times daily. (Patient not taking: Reported on 10/16/2016) 60 tablet 1 Not Taking at Unknown time    Current medications: Current Facility-Administered Medications  Medication Dose Route Frequency Provider Last Rate Last Dose  . acetaminophen (TYLENOL) tablet 1,000 mg  1,000 mg Oral Q6H PRN Baxter Hire, MD      . albuterol (PROVENTIL) (2.5 MG/3ML) 0.083% nebulizer solution 2.5 mg  2.5 mg Nebulization Q6H PRN Baxter Hire, MD      . carvedilol (COREG) tablet 3.125 mg  3.125 mg Oral BID WC Baxter Hire, MD   3.125 mg at 10/17/16 0820  . finasteride (PROSCAR) tablet 5 mg  5 mg Oral Daily Baxter Hire, MD   5 mg at 10/17/16 0820  . fluticasone (FLONASE) 50 MCG/ACT nasal spray 2 spray  2 spray Each Nare Daily PRN Baxter Hire, MD      . folic acid (FOLVITE) tablet 1 mg  1 mg Oral Daily Baxter Hire, MD   1 mg at 10/17/16 0820  . gabapentin (NEURONTIN) tablet 600 mg  600 mg Oral TID Baxter Hire, MD   600 mg at 10/17/16 5462  . hydrALAZINE (APRESOLINE) tablet 25 mg  25 mg Oral TID Baxter Hire, MD   25 mg at 10/17/16 0820  . isosorbide mononitrate (IMDUR) 24 hr tablet 30 mg  30 mg Oral Daily Baxter Hire, MD   30 mg at 10/17/16 7035  . loratadine (CLARITIN) tablet 10 mg  10 mg Oral Daily Baxter Hire, MD   10 mg at 10/17/16 0819  . meclizine (ANTIVERT) tablet 25 mg  25 mg Oral TID PRN Baxter Hire, MD      . mometasone-formoterol [facility]) 200-5 MCG/ACT inhaler 2 puff  2 puff Inhalation BID  Baxter Hire, MD   2 puff at 10/17/16 [id number]   . montelukast (SINGULAIR) tablet 10 mg  10 mg Oral Daily Baxter Hire, MD   10 mg at 10/17/16 0820  . multivitamin with minerals tablet 1 tablet  1 tablet Oral Daily Baxter Hire, MD   1 tablet at 10/17/16 0820  . nitroGLYCERIN (NITROSTAT) SL tablet 0.4 mg  0.4 mg Sublingual Q5 min PRN Baxter Hire, MD      . pantoprazole (PROTONIX) EC tablet 40 mg  40 mg Oral Daily Baxter Hire, MD   40 mg at 10/17/16 0819  . pyridOXINE (VITAMIN B-6) tablet 50 mg  50 mg Oral Daily Baxter Hire, MD   50 mg at 10/17/16 9604  . sertraline (ZOLOFT) tablet 50 mg  50 mg Oral QHS Baxter Hire, MD   50 mg at 10/16/16 2222  . simvastatin (ZOCOR) tablet 20 mg  20 mg Oral q1800 Baxter Hire, MD      . tamsulosin [facility]) capsule 0.4 mg  0.4 mg Oral Daily Baxter Hire, MD   0.4 mg at 10/17/16 0819  . vitamin B-12 (CYANOCOBALAMIN) tablet 500 mcg  500 mcg Oral Daily Baxter Hire, MD   500 mcg at 10/17/16 1000  . warfarin (COUMADIN) tablet 2.5 mg  2.5 mg Oral Once per day on Sun Fri Sat Baxter Hire, MD   2.5 mg at 10/16/16 2222  . [START ON 10/18/2016] warfarin (COUMADIN) tablet 5 mg  5 mg Oral Once per day on Mon Tue Wed Thu Johnston, John D, MD      . Warfarin - Physician Dosing Inpatient   Does not apply q1800 Baxter Hire, MD          Allergies: No Known Allergies    Past Medical History: Past Medical History:  Diagnosis Date  . A-fib ([place])   . Asthma   . Atrial fibrillation ([place])   . BPH (benign prostatic hyperplasia)   . CHF (congestive heart failure) ([place])   . Enlarged prostate   . Heart failure ([place])   . Hyperlipidemia   . Hypertension   . Leukemia ([place])    20 years ago  . Renal insufficiency   . Vertigo      Past Surgical History: Past Surgical History:  Procedure Laterality Date  . BICEPS TENDON REPAIR    . HERNIA REPAIR    . neck fusion       Family History: Family History  Problem Relation Age of Onset  . Hypertension Unknown   . Dementia  Mother   . Kidney failure Father   . Ovarian cancer Sister   . Lung cancer Brother      Social History: Social History   Social History  . Marital status: Married    Spouse name: N/A  . Number of children: N/A  . Years of education: N/A   Occupational History  . Not on file.   Social History Main Topics  . Smoking status: Never Smoker  . Smokeless tobacco: Never Used  . Alcohol use No  . Drug use: No  . Sexual activity: Yes   Other Topics Concern  . Not on file   Social History Narrative   Patient is illiterate     Review of Systems: Gen: No fevers or chills HEENT: No vision or hearing problems CV: No chest pain but does have worsening leg edema Resp: Denies cough or sputum production GI: Appetite is good  no diarrhea or constipation GU : Has history of BPH. No problems reported with voiding MS: Has chronic lower back pain and muscular spasm Derm:  No acute complaints Psych: Per wife, patient has dementia. He is also illiterate Heme: History of AML in the past Neuro: Generalized weakness Endocrine. No complaints  Vital Signs: Blood pressure (!) 146/87, pulse 65, temperature 97.7 F (36.5 C), temperature source Oral, resp. rate 16, weight 81.4 kg (179 lb 6.4 oz), SpO2 100 %.   Intake/Output Summary (Last 24 hours) at 10/17/16 1035 Last data filed at 10/17/16 1013  Gross per 24 hour  Intake              715 ml  Output              625 ml  Net               90 ml    Weight trends: Filed Weights   10/16/16 1538 10/16/16 2010  Weight: 85.1 kg (187 lb 9.8 oz) 81.4 kg (179 lb 6.4 oz)    Physical Exam: General:  No acute distress, laying in the bed   HEENT Anicteric, moist oral mucous membranes   Neck:  Supple   Lungs: Bilateral mild diffuse crackles   Heart::  Irregular rhythm, soft systolic murmur   Abdomen: Soft, distended, tympanic   Extremities:  2+ dependent pitting edema up to thighs   Neurologic: Alert, able to follow simple commands   Skin:  No acute rashes              Lab results: Basic Metabolic Panel:  Recent Labs Lab 10/16/16 1543 10/17/16 0155  NA 138 138  K 3.6 3.3*  CL 104 102  CO2 24 28  GLUCOSE 97 120*  BUN 20 24*  CREATININE 2.36* 1.95*  CALCIUM 8.4* 8.4*    Liver Function Tests:  Recent Labs Lab 10/17/16 0155  AST 25  ALT 20  ALKPHOS 64  BILITOT 0.8  PROT 6.0*  ALBUMIN 3.2*   No results for input(s): LIPASE, AMYLASE in the last 168 hours.  Recent Labs Lab 10/16/16 1732  AMMONIA 29    CBC:  Recent Labs Lab 10/16/16 1543  WBC 6.7  NEUTROABS 4.4  HGB 10.5*  HCT 30.5*  MCV 99.0  PLT 204    Cardiac Enzymes:  Recent Labs Lab 10/17/16 0804  TROPONINI 0.04*    BNP: Invalid input(s): POCBNP  CBG: No results for input(s): GLUCAP in the last 168 hours.  Microbiology: No results found for this or any previous visit (from the past 720 hour(s)).   Coagulation Studies:  Recent Labs  10/16/16 1543 10/17/16 0804  LABPROT 30.6* 30.5*  INR 2.86 2.85    Urinalysis: No results for input(s): COLORURINE, LABSPEC, PHURINE, GLUCOSEU, HGBUR, BILIRUBINUR, KETONESUR, PROTEINUR, UROBILINOGEN, NITRITE, LEUKOCYTESUR in the last 72 hours.  Invalid input(s): APPERANCEUR      Imaging: Dg Chest 2 View  Result Date: 10/16/2016 CLINICAL DATA:  10 pound weight gain over the past week. Clinical concern for fluid retention. Ascites and pedal edema. EXAM: CHEST  2 VIEW COMPARISON:  07/20/2016. FINDINGS: Decreased depth of inspiration with increased elevation of the left hemidiaphragm. Increased linear density in the left mid lung zone with interval linear density in the left lower lung zone. Clear right lung. Enlarged cardiac silhouette without significant change. Cervical spine fixation hardware. IMPRESSION: 1. Interval mild left mid and lower lung zone atelectasis with decreased lung volume on the left and increased elevation  of the left hemidiaphragm. 2. Stable cardiomegaly.  Electronically Signed   By: Claudie Revering M.D.   On: 10/16/2016 16:11      Assessment & Plan: Pt is a 76 y.o. 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 Acute renal failure 2. Chronic kidney disease stage III. Baseline creatinine 1.36/GFR 50 on 07/20/2016 3. Anasarca 4. BPH 5. Tricuspid regurgitation noted on echo in July 2017  Plan: Restart oral diuretics, torsemide and 40 mg twice a day and assess response Avoid hypotension Strict low-salt diet Bladder scan Evaluate for pulmonary hypertension/obstructive sleep apnea We'll follow

## 2016-10-17 NOTE — Progress Notes (Signed)
Pt. Restless/agitated, spoke with Dr. Jannifer Franklin, new orders, [place].

## 2016-10-17 NOTE — Progress Notes (Signed)
[facility] [facility] [place] NAME: Dylan Faulkner    MR#:  [id number]  DATE OF BIRTH:  07-01-1940  SUBJECTIVE:  CHIEF COMPLAINT:   Chief Complaint  Patient presents with  . fluid retention   -Admitted with confusion and anasarca. Doing much better according to wife. Patient has mild dementia and slurred speech at baseline.  REVIEW OF SYSTEMS:  Review of Systems  Constitutional: Negative for chills, fever and malaise/fatigue.  HENT: Negative for congestion, ear discharge, hearing loss and nosebleeds.   Respiratory: Negative for cough, shortness of breath and wheezing.   Cardiovascular: Positive for leg swelling. Negative for chest pain and palpitations.  Gastrointestinal: Negative for abdominal pain, constipation, diarrhea, nausea and vomiting.  Genitourinary: Negative for dysuria.  Musculoskeletal: Positive for myalgias.       Muscle cramps  Neurological: Negative for dizziness, seizures and headaches.    DRUG ALLERGIES:  No Known Allergies  VITALS:  Blood pressure (!) 146/87, pulse 65, temperature 97.7 F (36.5 C), temperature source Oral, resp. rate 16, weight 81.4 kg (179 lb 6.4 oz), SpO2 100 %.  PHYSICAL EXAMINATION:  Physical Exam  GENERAL:  76 y.o.-year-old patient lying in the bed with no acute distress.  EYES: Pupils equal, round, reactive to light and accommodation. No scleral icterus. Extraocular muscles intact.  HEENT: Head atraumatic, normocephalic. Oropharynx and nasopharynx clear.  NECK:  Supple, no jugular venous distention. No thyroid enlargement, no tenderness.  LUNGS: Normal breath sounds bilaterally, no wheezing, rhonchi or crepitation. No use of accessory muscles of respiration. Bibasilar crackles heard CARDIOVASCULAR: S1, S2 normal. No murmurs, rubs, or gallops.  ABDOMEN: Soft, nontender, nondistended. Bowel sounds present. No organomegaly or mass.  EXTREMITIES: No cyanosis, or clubbing. 1+ bilateral pedal  edema NEUROLOGIC: Cranial nerves II through XII are intact.Has slurred speech Muscle strength 5/5 in all extremities. Sensation intact. Gait not checked. Global weakness noted PSYCHIATRIC: The patient is alert and oriented x 2.  SKIN: No obvious rash, lesion, or ulcer.    LABORATORY PANEL:   CBC  Recent Labs Lab 10/16/16 1543  WBC 6.7  HGB 10.5*  HCT 30.5*  PLT 204   ------------------------------------------------------------------------------------------------------------------  Chemistries   Recent Labs Lab 10/17/16 0155  NA 138  K 3.3*  CL 102  CO2 28  GLUCOSE 120*  BUN 24*  CREATININE 1.95*  CALCIUM 8.4*  AST 25  ALT 20  ALKPHOS 64  BILITOT 0.8   ------------------------------------------------------------------------------------------------------------------  Cardiac Enzymes  Recent Labs Lab 10/17/16 0804  TROPONINI 0.04*   ------------------------------------------------------------------------------------------------------------------  RADIOLOGY:  Dg Chest 2 View  Result Date: 10/16/2016 CLINICAL DATA:  10 pound weight gain over the past week. Clinical concern for fluid retention. Ascites and pedal edema. EXAM: CHEST  2 VIEW COMPARISON:  07/20/2016. FINDINGS: Decreased depth of inspiration with increased elevation of the left hemidiaphragm. Increased linear density in the left mid lung zone with interval linear density in the left lower lung zone. Clear right lung. Enlarged cardiac silhouette without significant change. Cervical spine fixation hardware. IMPRESSION: 1. Interval mild left mid and lower lung zone atelectasis with decreased lung volume on the left and increased elevation of the left hemidiaphragm. 2. Stable cardiomegaly. Electronically Signed   By: Claudie Revering M.D.   On: 10/16/2016 16:11    EKG:   Orders placed or performed during the hospital encounter of 10/16/16  . ED EKG  . ED EKG  . EKG 12-Lead  . EKG 12-Lead  . EKG 12-Lead  .  EKG 12-Lead    ASSESSMENT AND PLAN:   76 year old male with past medical history significant for diastolic CHF, atrial fibrillation on warfarin, hypertension, BPH, mild dementia presents to hospital secondary to worsening confusion and anasarca.  #1 anasarca-acute on chronic diastolic CHF exacerbation. -Has increased right-sided pressures and pulmonary hypertension. -Untreated sleep apnea, patient has not been using his CPAP and also increased salt intake at home. -Received IV Lasix yesterday, restarted back on torsemide now. -Replaced potassium appropriately -Daily weights, strict input and output monitoring  #2 metabolic encephalopathy-has mild dementia at baseline -Ammonia within normal limits. According to wife, confusion is improving. -Decrease his gabapentin dose  #3 acute renal failure on CK D-baseline creatinine around 1.4, creatinine greater than 2 on admission. -Received IV Lasix in the ER, gentle hydration overnight and restarted back on torsemide. -minimal proteinuria  #4 chronic A. fib-rate controlled. Patient on Coreg. Continue warfarin for anticoagulation.  #5 DVT prophylaxis-on warfarin     All the records are reviewed and case discussed with Care Management/Social Workerr. Management plans discussed with the patient, family and they are in agreement.  CODE STATUS: Full code  TOTAL TIME TAKING CARE OF THIS PATIENT: 37 minutes.   POSSIBLE D/C IN 1-2 DAYS, DEPENDING ON CLINICAL CONDITION.   Gladstone Lighter M.D on 10/17/2016 at 11:53 AM  Between 7am to 6pm - Pager - [phone]  After 6pm go to [email or web address] - password [email or web address] [facility]  Office  [phone]  CC: Primary care physician; Donnie Coffin, MD

## 2016-10-17 NOTE — Progress Notes (Signed)
Per CCMD pt. Had 8 beats of wide QRS. Prime paged, awaiting call back.

## 2016-10-17 NOTE — Progress Notes (Signed)
Pt. Arrived via stretcher, transferred to bed with standby assist. Tele applied and verified with Andee Poles, CNA. Skin assessed with Wyona Almas, RN. Discolorations to BUE, scabbed abrasion to left elbow and right 1st digit. General room orientation given. Instruction on how to use ascom and call bell system given. Fall risk sheet explained and signed. Wife at bedside. Pt alert and oriented requesting sandwich tray, which was given and a shasta.

## 2016-10-17 NOTE — Progress Notes (Signed)
Dr. Marcille Blanco returned page, no new orders for run of v-tach, just continue to monitor pt.

## 2016-10-17 NOTE — Progress Notes (Signed)
Patient bladder scanned at request of MD, scanned twice, largest volume recorded was 59ml, first scan showed 81ml, second scan showed 94. Will continue to assess and monitor.

## 2016-10-17 NOTE — Plan of Care (Signed)
Problem: Cardiac: Goal: Ability to achieve and maintain adequate cardiopulmonary perfusion will improve Outcome: Not Progressing Patient in Afibm, HR unstable from 100-150's, MD paged and notified, 5mg  IV metoprolol given. Will continue to monitor.

## 2016-10-17 NOTE — Progress Notes (Signed)
Dr. Marcille Blanco returned page, new orders see emar.

## 2016-10-17 NOTE — Progress Notes (Addendum)
Patients HR uncontrolled from 100-150's with no activity. MD notified. Orders to check magnesium and give 5mg  IV metoprolol . Will give and continue to monitor.

## 2016-10-18 ENCOUNTER — Inpatient Hospital Stay: Payer: Medicare HMO

## 2016-10-18 LAB — URINALYSIS, COMPLETE (UACMP) WITH MICROSCOPIC
Bacteria, UA: NONE SEEN
Bilirubin Urine: NEGATIVE
GLUCOSE, UA: NEGATIVE mg/dL
Hgb urine dipstick: NEGATIVE
KETONES UR: NEGATIVE mg/dL
Leukocytes, UA: NEGATIVE
Nitrite: NEGATIVE
PH: 5 (ref 5.0–8.0)
PROTEIN: NEGATIVE mg/dL
RBC / HPF: NONE SEEN RBC/hpf (ref 0–5)
SQUAMOUS EPITHELIAL / LPF: NONE SEEN
Specific Gravity, Urine: 1.006 (ref 1.005–1.030)
WBC UA: NONE SEEN WBC/hpf (ref 0–5)

## 2016-10-18 LAB — BASIC METABOLIC PANEL
Anion gap: 8 (ref 5–15)
BUN: 25 mg/dL — AB (ref 6–20)
CO2: 29 mmol/L (ref 22–32)
CREATININE: 1.54 mg/dL — AB (ref 0.61–1.24)
Calcium: 8.9 mg/dL (ref 8.9–10.3)
Chloride: 101 mmol/L (ref 101–111)
GFR calc Af Amer: 50 mL/min — ABNORMAL LOW (ref >60)
GFR, EST NON AFRICAN AMERICAN: 43 mL/min — AB (ref >60)
GLUCOSE: 94 mg/dL (ref 65–99)
POTASSIUM: 4.2 mmol/L (ref 3.5–5.1)
SODIUM: 138 mmol/L (ref 135–145)

## 2016-10-18 MED ORDER — CARVEDILOL 6.25 MG PO TABS
6.2500 mg | ORAL_TABLET | Freq: Once | ORAL | Status: AC
  Administered 2016-10-18: 6.25 mg via ORAL
  Filled 2016-10-18: qty 1

## 2016-10-18 MED ORDER — RISPERIDONE 0.5 MG PO TABS
0.5000 mg | ORAL_TABLET | Freq: Every day | ORAL | Status: DC
  Administered 2016-10-18 – 2016-10-19 (×2): 0.5 mg via ORAL
  Filled 2016-10-18 (×4): qty 1

## 2016-10-18 MED ORDER — MAGNESIUM SULFATE 2 GM/50ML IV SOLN
2.0000 g | Freq: Once | INTRAVENOUS | Status: AC
  Administered 2016-10-18: 2 g via INTRAVENOUS
  Filled 2016-10-18: qty 50

## 2016-10-18 MED ORDER — CARVEDILOL 6.25 MG PO TABS
6.2500 mg | ORAL_TABLET | Freq: Two times a day (BID) | ORAL | Status: DC
  Administered 2016-10-18 – 2016-10-20 (×5): 6.25 mg via ORAL
  Filled 2016-10-18 (×5): qty 1

## 2016-10-18 NOTE — Progress Notes (Signed)
Making rounds on pt, when his wife came out of bathroom with urinal, I asked her how much urine was in urinal and she stated about this much, (using her fingers to measure amount of urin in urinal) and then said I emptied the urinal at least 5 times. I explained to her we need to keep a record of his output. She stated she understood and if she emptied it again she would write down the amount.

## 2016-10-18 NOTE — Progress Notes (Signed)
Pt. HR increases into 140-150 non-sustained when agitated or restless.

## 2016-10-18 NOTE — Progress Notes (Addendum)
[facility] NAME: Dylan Faulkner    MR#:  [id number]  DATE OF BIRTH:  [birth year]  SUBJECTIVE:  CHIEF COMPLAINT:   Chief Complaint  Patient presents with  . fluid retention   -renal function and anasarca improving. But very confused last night and some visual hallucinations  REVIEW OF SYSTEMS:  Review of Systems  Constitutional: Negative for chills, fever and malaise/fatigue.  HENT: Negative for congestion, ear discharge, hearing loss and nosebleeds.   Respiratory: Negative for cough, shortness of breath and wheezing.   Cardiovascular: Positive for leg swelling. Negative for chest pain and palpitations.  Gastrointestinal: Negative for abdominal pain, constipation, diarrhea, nausea and vomiting.  Genitourinary: Negative for dysuria.  Musculoskeletal: Positive for myalgias.       Muscle cramps  Neurological: Negative for dizziness, seizures and headaches.    DRUG ALLERGIES:  No Known Allergies  VITALS:  Blood pressure 137/86, pulse (!) 113, temperature 98.8 F (37.1 C), resp. rate 20, height 5\' 8"  (1.727 m), weight 79.1 kg (174 lb 6 oz), SpO2 97 %.  PHYSICAL EXAMINATION:  Physical Exam  GENERAL:  76 y.o.-year-old patient lying in the bed with no acute distress.  EYES: Pupils equal, round, reactive to light and accommodation. No scleral icterus. Extraocular muscles intact.  HEENT: Head atraumatic, normocephalic. Oropharynx and nasopharynx clear.  NECK:  Supple, no jugular venous distention. No thyroid enlargement, no tenderness.  LUNGS: Normal breath sounds bilaterally, no wheezing, rales rhonchi or crepitation. No use of accessory muscles of respiration.  CARDIOVASCULAR: S1, S2 normal. No murmurs, rubs, or gallops.  ABDOMEN: Soft, nontender, nondistended. Bowel sounds present. No organomegaly or mass.  EXTREMITIES: No cyanosis, or clubbing. 1+ bilateral pedal edema NEUROLOGIC: Cranial nerves II through XII are intact.Has  slurred speech Muscle strength 5/5 in all extremities. Sensation intact. Gait not checked. Global weakness noted PSYCHIATRIC: The patient is alert and oriented x 2.  SKIN: No obvious rash, lesion, or ulcer.    LABORATORY PANEL:   CBC  Recent Labs Lab 10/16/16 1543  WBC 6.7  HGB 10.5*  HCT 30.5*  PLT 204   ------------------------------------------------------------------------------------------------------------------  Chemistries   Recent Labs Lab 10/17/16 0155 10/17/16 0804 10/18/16 0535  NA 138  --  138  K 3.3*  --  4.2  CL 102  --  101  CO2 28  --  29  GLUCOSE 120*  --  94  BUN 24*  --  25*  CREATININE 1.95*  --  1.54*  CALCIUM 8.4*  --  8.9  MG  --  1.2*  --   AST 25  --   --   ALT 20  --   --   ALKPHOS 64  --   --   BILITOT 0.8  --   --    ------------------------------------------------------------------------------------------------------------------  Cardiac Enzymes  Recent Labs Lab 10/17/16 0804  TROPONINI 0.04*   ------------------------------------------------------------------------------------------------------------------  RADIOLOGY:  Dg Chest 2 View  Result Date: 10/16/2016 CLINICAL DATA:  10 pound weight gain over the past week. Clinical concern for fluid retention. Ascites and pedal edema. EXAM: CHEST  2 VIEW COMPARISON:  07/20/2016. FINDINGS: Decreased depth of inspiration with increased elevation of the left hemidiaphragm. Increased linear density in the left mid lung zone with interval linear density in the left lower lung zone. Clear right lung. Enlarged cardiac silhouette without significant change. Cervical spine fixation hardware. IMPRESSION: 1. Interval mild left mid and lower lung zone atelectasis with decreased lung volume on  the left and increased elevation of the left hemidiaphragm. 2. Stable cardiomegaly. Electronically Signed   By: Claudie Revering M.D.   On: 10/16/2016 16:11   Ct Head Wo Contrast  Result Date: 10/18/2016 CLINICAL  DATA:  Confusion/altered mental status EXAM: CT HEAD WITHOUT CONTRAST TECHNIQUE: Contiguous axial images were obtained from the base of the skull through the vertex without intravenous contrast. COMPARISON:  October 22, 2015 FINDINGS: Brain: Mild to moderate diffuse atrophy is stable. There is no intracranial mass, hemorrhage, extra-axial fluid collection, or midline shift. There is patchy small vessel disease throughout the centra semiovale bilaterally. Elsewhere gray-white compartments appear normal. No acute infarct evident. Vascular: There is no hyperdense vessel. There is calcification in each carotid siphon region. Skull: The bony calvarium appears intact. Sinuses/Orbits: There is mucosal thickening in several ethmoid air cells bilaterally. Other visualized paranasal sinuses are clear. Visualized orbits appear symmetric bilaterally. Other: Mastoids on the left are nearly aplastic. Aerated mastoid air cells bilaterally are clear. IMPRESSION: Stable atrophy with patchy periventricular small vessel disease. No intracranial mass, hemorrhage, or extra-axial fluid collection. No acute infarct evident. Areas of arterial vascular calcification noted. Mucosal thickening in ethmoid air cells present bilaterally. Electronically Signed   By: Lowella Grip III M.D.   On: 10/18/2016 11:21    EKG:   Orders placed or performed during the hospital encounter of 10/16/16  . ED EKG  . ED EKG  . EKG 12-Lead  . EKG 12-Lead  . EKG 12-Lead  . EKG 12-Lead    ASSESSMENT AND PLAN:   76 year old male with past medical history significant for diastolic CHF, atrial fibrillation on warfarin, hypertension, BPH, mild dementia presents to hospital secondary to worsening confusion and anasarca.  #1 anasarca-acute on chronic diastolic CHF exacerbation. -Has increased right-sided pressures and pulmonary hypertension. -Untreated sleep apnea, patient has not been using his CPAP and also increased salt intake at home. -Received  IV Lasix  And now restarted back on torsemide. -Replaced potassium appropriately- improved anasarca -Daily weights, strict input and output monitoring  #2 metabolic encephalopathy-has mild dementia at baseline -Ammonia within normal limits. According to wife, confusion is improving. -hold gabapentin for now, d/c flexeril. Worsening delirium at night- received haldol last night. Add risperidone - CT head and UA ordered  #3 acute renal failure on CKD-baseline creatinine around 1.4, creatinine greater than 2 on admission. -Received IV Lasix in the ER, and restarted back on torsemide. -minimal proteinuria, improving creatinine  #4 chronic A. fib-rate . Patient on Coreg. Dose increased today as HR elevated Continue warfarin for anticoagulation. Replaced magnesium for hypomagnesemia  #5 DVT prophylaxis-on warfarin     All the records are reviewed and case discussed with Care Management/Social Workerr. Management plans discussed with the patient, family and they are in agreement.  CODE STATUS: Full code  TOTAL TIME TAKING CARE OF THIS PATIENT: 36 minutes.   POSSIBLE D/C TOMORROW, DEPENDING ON CLINICAL CONDITION.   Gladstone Lighter M.D on 10/18/2016 at 1:08 PM  Between 7am to 6pm - Pager - [phone]  After 6pm go to [email or web address] - password [email or web address] [facility]  Office  [phone]  CC: Primary care physician; Donnie Coffin, MD

## 2016-10-19 LAB — BASIC METABOLIC PANEL
Anion gap: 10 (ref 5–15)
BUN: 34 mg/dL — ABNORMAL HIGH (ref 6–20)
CHLORIDE: 98 mmol/L — AB (ref 101–111)
CO2: 26 mmol/L (ref 22–32)
CREATININE: 1.87 mg/dL — AB (ref 0.61–1.24)
Calcium: 8.7 mg/dL — ABNORMAL LOW (ref 8.9–10.3)
GFR calc non Af Amer: 34 mL/min — ABNORMAL LOW (ref >60)
GFR, EST AFRICAN AMERICAN: 39 mL/min — AB (ref >60)
Glucose, Bld: 101 mg/dL — ABNORMAL HIGH (ref 65–99)
POTASSIUM: 3.7 mmol/L (ref 3.5–5.1)
Sodium: 134 mmol/L — ABNORMAL LOW (ref 135–145)

## 2016-10-19 LAB — CBC
HCT: 30.8 % — ABNORMAL LOW (ref 40.0–52.0)
Hemoglobin: 10.8 g/dL — ABNORMAL LOW (ref 13.0–18.0)
MCH: 34.8 pg — AB (ref 26.0–34.0)
MCHC: 35 g/dL (ref 32.0–36.0)
MCV: 99.4 fL (ref 80.0–100.0)
PLATELETS: 180 10*3/uL (ref 150–440)
RBC: 3.1 MIL/uL — ABNORMAL LOW (ref 4.40–5.90)
RDW: 14.7 % — ABNORMAL HIGH (ref 11.5–14.5)
WBC: 8.6 10*3/uL (ref 3.8–10.6)

## 2016-10-19 LAB — TSH: TSH: 2.981 u[IU]/mL (ref 0.350–4.500)

## 2016-10-19 LAB — PROTIME-INR
INR: 2.09
Prothrombin Time: 23.8 seconds — ABNORMAL HIGH (ref 11.4–15.2)

## 2016-10-19 LAB — MAGNESIUM: Magnesium: 1.8 mg/dL (ref 1.7–2.4)

## 2016-10-19 LAB — VITAMIN B12: VITAMIN B 12: 448 pg/mL (ref 180–914)

## 2016-10-19 MED ORDER — RISPERIDONE 0.5 MG PO TABS: 0.5000 mg | ORAL_TABLET | Freq: Every day | ORAL | 0 refills | Status: DC

## 2016-10-19 MED ORDER — TORSEMIDE 20 MG PO TABS: 20.0000 mg | ORAL_TABLET | Freq: Every day | ORAL | 2 refills | Status: AC

## 2016-10-19 MED ORDER — CARVEDILOL 6.25 MG PO TABS: 6.2500 mg | ORAL_TABLET | Freq: Two times a day (BID) | ORAL | 2 refills | Status: AC

## 2016-10-19 NOTE — Evaluation (Signed)
Physical Therapy Evaluation Patient Details Name: Dylan Faulkner MRN: [id number] DOB: 1940/09/05 Today's Date: 10/19/2016   History of Present Illness  Pt is a 76 y.o. M presented to ED 10/16/2016 with mild SOB, 10# weight gain over the past week, increased confusion and increased LE edema. Pt admitted 10/16/2016 with acute on chronic renal failure, acute on chronic CHF exacerbation, and a-fib RVR. PMH: neck fusion, back pain, BPH, CKD, Leukemia, vertigo, bicep repair, CHF, dementia with confusion, a-fib, asthma, HLD, HTN.     Clinical Impression  Prior to admission pt reports use of SPC for household ADLs and 2-wheeled RW for community ambulation. According to pt and niece a home health nurse comes to the house periodically for "check-ups" and will assist with bathing and dressing. Pt requires CGA and RW for sit-to-stand (mild posterior lean on initial stand; self-corrected by bracing legs against bed) and transfer from EOB to bedside chair. Pt presents with cognitive deficits (baseline due to dementia per niece); not oriented to place (stated he was at his brother's house) or situation. Pt denies pain beginning and end of session. Pt will benefit from continued skilled PT for strength, balance, functional mobility and activity tolerance deficits (HR mostly 85 to 112 bpm throughout session via telemetry (spike to 126 according to telemetry screen); nursing notified). Recommended discharge to home with HHPT and 24 hour supervision for functional mobility.    Follow Up Recommendations Home health PT;Supervision/Assistance - 24 hour (24/7 supervision for functional mobility)    Equipment Recommendations  Rolling walker with 5" wheels    Recommendations for Other Services       Precautions / Restrictions Precautions Precautions: Fall Restrictions Weight Bearing Restrictions: No      Mobility  Bed Mobility Overal bed mobility: Modified Independent Bed Mobility: Supine to Sit     Supine to  sit: Modified independent (Device/Increase time);HOB elevated     General bed mobility comments: pt able to perform supine-to-sit with increased time/effort and use of bed rails with HOB elevated  Transfers Overall transfer level: Needs assistance Equipment used: Rolling walker (2 wheeled) Transfers: Sit to/from [facility] Sit to Stand: Min guard Stand pivot transfers: Min guard       General transfer comment: pt able to perform sit-to-stand from EOB with CGA and RW for safety due to backwards lean (pt pushed B LE against bed to help keep upright); once pt was standing without posterior lean he was able to perform a modified stand-pivot transfer with a few small steps to bedside chair with CGA and RW  Ambulation/Gait             General Gait Details: deferred due to elevated HR via telemetry  Stairs            Wheelchair Mobility    Modified Rankin (Stroke Patients Only)       Balance Overall balance assessment: Needs assistance Sitting-balance support: Bilateral upper extremity supported;Feet supported Sitting balance-Leahy Scale: Good Sitting balance - Comments: static sitting balance occassionally reaching within base of support with no overt loss of balance   Standing balance support: Bilateral upper extremity supported Standing balance-Leahy Scale: Fair Standing balance comment: on initial stand pt demonstrates posterior lean with B LE braced against the bed to help obtain upright position with CGA and RW for safety                             Pertinent Vitals/Pain Pain  Assessment: No/denies pain  HR - 85 to 112 bpm throughout session via telemetry (spike to 126 according to telemetry screen); nursing notified O2 - 94 to 100% on RA throughout session via pulse ox    Home Living Family/patient expects to be discharged to:: Private residence Living Arrangements: Spouse/significant other Available Help at Discharge: Family Type  of Home: House Home Access: Stairs to enter Entrance Stairs-Rails: Chemical engineer of Steps: [place]: One level Home Equipment: Environmental consultant - 2 wheels;Cane - single point      Prior Function Level of Independence: Independent with assistive device(s)         Comments: modified independent with ADLs; SPC for household activity; RW for community ambulation; pt reports no falls in past 6 months but pt's niece reports that he has "passed out multiple times"     Hand Dominance   Dominant Hand: Right    Extremity/Trunk Assessment   Upper Extremity Assessment Upper Extremity Assessment: Overall WFL for tasks assessed    Lower Extremity Assessment Lower Extremity Assessment: Generalized weakness;RLE deficits/detail;LLE deficits/detail RLE Deficits / Details: R hip abduction at least 3/5; R hip flexion 3+/5; R knee flexion/extension 5/5; R ankle DF at least 3/5 LLE Deficits / Details: L hip abduction at least 3/5; L hip flexion 3+/5; L knee flexion/extension 5/5; L ankle DF at least 3/5    Cervical / Trunk Assessment Cervical / Trunk Assessment: Normal  Communication   Communication: No difficulties  Cognition Arousal/Alertness: Awake/alert Behavior During Therapy: WFL for tasks assessed/performed Overall Cognitive Status: History of cognitive impairments - at baseline                                 General Comments: dementia; pt oriented to self, month and day; pt not oriented to year, place (stated he was at his brother's house), and situation; per niece this is his baseline      General Comments      Exercises     Assessment/Plan    PT Assessment Patient needs continued PT services  PT Problem List Decreased strength;Decreased activity tolerance;Decreased balance;Decreased mobility;Decreased knowledge of use of DME       PT Treatment Interventions DME instruction;Gait training;Stair training;Functional mobility training;Therapeutic  activities;Therapeutic exercise;Balance training;Patient/family education    PT Goals (Current goals can be found in the Care Plan section)  Acute Rehab PT Goals Patient Stated Goal: to get stronger PT Goal Formulation: With patient Time For Goal Achievement: 11/02/16 Potential to Achieve Goals: Good    Frequency Min 2X/week   Barriers to discharge Decreased caregiver support      Co-evaluation               AM-PAC PT "6 Clicks" Daily Activity  Outcome Measure Difficulty turning over in bed (including adjusting bedclothes, sheets and blankets)?: A Little Difficulty moving from lying on back to sitting on the side of the bed? : A Lot Difficulty sitting down on and standing up from a chair with arms (e.g., wheelchair, bedside commode, etc,.)?: Total Help needed moving to and from a bed to chair (including a wheelchair)?: A Little Help needed walking in hospital room?: A Little Help needed climbing 3-5 steps with a railing? : A Lot 6 Click Score: 14    End of Session Equipment Utilized During Treatment: Gait belt Activity Tolerance: Treatment limited secondary to medical complications (Comment) (due to elevated HR when transferring from EOB to bedside chair)  Patient left: in chair;with call bell/phone within reach;with chair alarm set;with family/visitor present Nurse Communication: Mobility status;Precautions;Other (comment) (nursing notified of increase in HR with activity) PT Visit Diagnosis: Unsteadiness on feet (R26.81);Other abnormalities of gait and mobility (R26.89);Muscle weakness (generalized) (M62.81)    Time: 9471-2527 PT Time Calculation (min) (ACUTE ONLY): 32 min   Charges:         PT G CodesLaqueta Carina, SPT 10/23/2016,2:43 PM [phone]

## 2016-10-19 NOTE — Progress Notes (Signed)
[place] [facility] [place] NAME: Dylan Faulkner    MR#:  [id number]  DATE OF BIRTH:  Sep 30, 1940  SUBJECTIVE:  CHIEF COMPLAINT:   Chief Complaint  Patient presents with  . fluid retention   - sun downing noted, more alert and oriented and pleasant this mornings -PT consult pending  REVIEW OF SYSTEMS:  Review of Systems  Constitutional: Negative for chills, fever and malaise/fatigue.  HENT: Negative for congestion, ear discharge, hearing loss and nosebleeds.   Respiratory: Negative for cough, shortness of breath and wheezing.   Cardiovascular: Positive for leg swelling. Negative for chest pain and palpitations.  Gastrointestinal: Negative for abdominal pain, constipation, diarrhea, nausea and vomiting.  Genitourinary: Negative for dysuria.  Musculoskeletal: Positive for myalgias.       Muscle cramps  Neurological: Negative for dizziness, seizures and headaches.    DRUG ALLERGIES:  No Known Allergies  VITALS:  Blood pressure (!) 142/83, pulse (!) 43, temperature 98.7 F (37.1 C), temperature source Oral, resp. rate 20, height 5\' 8"  (1.727 m), weight 79.4 kg (175 lb), SpO2 95 %.  PHYSICAL EXAMINATION:  Physical Exam  GENERAL:  76 y.o.-year-old patient lying in the bed with no acute distress.  EYES: Pupils equal, round, reactive to light and accommodation. No scleral icterus. Extraocular muscles intact.  HEENT: Head atraumatic, normocephalic. Oropharynx and nasopharynx clear.  NECK:  Supple, no jugular venous distention. No thyroid enlargement, no tenderness.  LUNGS: Normal breath sounds bilaterally, no wheezing, rales rhonchi or crepitation. No use of accessory muscles of respiration.  CARDIOVASCULAR: S1, S2 normal. No murmurs, rubs, or gallops.  ABDOMEN: Soft, nontender, nondistended. Bowel sounds present. No organomegaly or mass.  EXTREMITIES: No cyanosis, or clubbing. 1+ bilateral pedal edema NEUROLOGIC: Cranial nerves II through XII  are intact.Has slurred speech Muscle strength 5/5 in all extremities. Sensation intact. Gait not checked. Global weakness noted PSYCHIATRIC: The patient is alert and oriented x 2.  SKIN: No obvious rash, lesion, or ulcer.    LABORATORY PANEL:   CBC  Recent Labs Lab 10/19/16 0543  WBC 8.6  HGB 10.8*  HCT 30.8*  PLT 180   ------------------------------------------------------------------------------------------------------------------  Chemistries   Recent Labs Lab 10/17/16 0155  10/19/16 0543  NA 138  < > 134*  K 3.3*  < > 3.7  CL 102  < > 98*  CO2 28  < > 26  GLUCOSE 120*  < > 101*  BUN 24*  < > 34*  CREATININE 1.95*  < > 1.87*  CALCIUM 8.4*  < > 8.7*  MG  --   < > 1.8  AST 25  --   --   ALT 20  --   --   ALKPHOS 64  --   --   BILITOT 0.8  --   --   < > = values in this interval not displayed. ------------------------------------------------------------------------------------------------------------------  Cardiac Enzymes  Recent Labs Lab 10/17/16 0804  TROPONINI 0.04*   ------------------------------------------------------------------------------------------------------------------  RADIOLOGY:  Ct Head Wo Contrast  Result Date: 10/18/2016 CLINICAL DATA:  Confusion/altered mental status EXAM: CT HEAD WITHOUT CONTRAST TECHNIQUE: Contiguous axial images were obtained from the base of the skull through the vertex without intravenous contrast. COMPARISON:  October 22, 2015 FINDINGS: Brain: Mild to moderate diffuse atrophy is stable. There is no intracranial mass, hemorrhage, extra-axial fluid collection, or midline shift. There is patchy small vessel disease throughout the centra semiovale bilaterally. Elsewhere gray-white compartments appear normal. No acute infarct evident. Vascular: There is  no hyperdense vessel. There is calcification in each carotid siphon region. Skull: The bony calvarium appears intact. Sinuses/Orbits: There is mucosal thickening in several  ethmoid air cells bilaterally. Other visualized paranasal sinuses are clear. Visualized orbits appear symmetric bilaterally. Other: Mastoids on the left are nearly aplastic. Aerated mastoid air cells bilaterally are clear. IMPRESSION: Stable atrophy with patchy periventricular small vessel disease. No intracranial mass, hemorrhage, or extra-axial fluid collection. No acute infarct evident. Areas of arterial vascular calcification noted. Mucosal thickening in ethmoid air cells present bilaterally. Electronically Signed   By: Lowella Grip III M.D.   On: 10/18/2016 11:21    EKG:   Orders placed or performed during the hospital encounter of 10/16/16  . ED EKG  . ED EKG  . EKG 12-Lead  . EKG 12-Lead  . EKG 12-Lead  . EKG 12-Lead    ASSESSMENT AND PLAN:   76 year old male with past medical history significant for diastolic CHF, atrial fibrillation on warfarin, hypertension, BPH, mild dementia presents to hospital secondary to worsening confusion and anasarca.  #1 anasarca-acute on chronic diastolic CHF exacerbation. -Has increased right-sided pressures and pulmonary hypertension. -Untreated sleep apnea, patient has not been using his CPAP and also increased salt intake at home. -Received IV Lasix  And restarted back on torsemide- will hold today as renal function slight worse. -Replaced potassium appropriately- improved anasarca -Daily weights, strict input and output monitoring  #2 metabolic encephalopathy-has mild dementia at baseline -Ammonia within normal limits. According to wife, confusion is improving. -hold gabapentin for now, d/c flexeril. delirium at night- received haldol once. Added risperidone with improvement - CT head and UA normal- no acute abnormalities. H/o head bleed- no encephalomalacia - outpatient neurology f/u recommended for further testing -b12 and tsh ordered  #3 acute renal failure on CKD-baseline creatinine around 1.4, creatinine greater than 2 on  admission. -Received IV Lasix in the ER, and restarted back on torsemide- hold today and decrease dose at discharge as slight worsening in creatinine noted. Continue to follow up with nephrology as outpatient.. -minimal proteinuria, improving creatinine  #4 chronic A. fib-rate . Patient on Coreg. Dose increased today as HR elevated Continue warfarin for anticoagulation. Replaced magnesium for hypomagnesemia  #5 DVT prophylaxis-on warfarin   PT recommended home health   All the records are reviewed and case discussed with Care Management/Social Workerr. Management plans discussed with the patient, family and they are in agreement.  CODE STATUS: Full code  TOTAL TIME TAKING CARE OF THIS PATIENT: 36 minutes.   POSSIBLE D/C TOMORROW, DEPENDING ON CLINICAL CONDITION.   Gladstone Lighter M.D on 10/19/2016 at 4:39 PM  Between 7am to 6pm - Pager - [phone]  After 6pm go to [email or web address] - password [email or web address] [facility]  Office  [phone]  CC: Primary care physician; Donnie Coffin, MD

## 2016-10-19 NOTE — Care Management (Signed)
Spoke with patient's wife.  She verbalizes concern that patient "gets in a state like  he is going through withdrawal or in a seizure".  Patient does not drink alcohol or smoke.   Wife discussed wanted patient to been seen by neurology to determine if he has dementia "or something else."  Discussed contacting her pcp and asking him to make this referral for dementia work up.  She is agreeable to have home health services again.  Referral called to [facility] as this agency has provided services in the past.  Patient does have scales but does not weigh daily.  Patient has access to walkers.  Patient does "get the keys and drives" and sometimes forgets where he is.

## 2016-10-19 NOTE — Progress Notes (Signed)
[facility]  ROUNDING NOTE   Subjective:  Patient resting comfortably in bed. Creatinine slightly higher today at 1.8. Patient is off of diuretic therapy at the moment.  Objective:  Vital signs in last 24 hours:  Temp:  [97.7 F (36.5 C)-99.1 F (37.3 C)] 98.7 F (37.1 C) (07/17 1553) Pulse Rate:  [43-187] 43 (07/17 1553) Resp:  [16-20] 20 (07/17 1553) BP: (113-142)/(70-83) 142/83 (07/17 1553) SpO2:  [95 %-97 %] 95 % (07/17 1553) Weight:  [79.4 kg (175 lb)] 79.4 kg (175 lb) (07/17 0525)  Weight change: 0.283 kg (10 oz) Filed Weights   10/16/16 2010 10/18/16 0431 10/19/16 0525  Weight: 81.4 kg (179 lb 6.4 oz) 79.1 kg (174 lb 6 oz) 79.4 kg (175 lb)    Intake/Output: I/O last 3 completed shifts: In: 61 [P.O.:480; IV Piggyback:50] Out: 6269 [Urine:1650]   Intake/Output this shift:  Total I/O In: 480 [P.O.:480] Out: -   Physical Exam: General: No acute distress  Head: Normocephalic, atraumatic. Moist oral mucosal membranes  Eyes: Anicteric  Neck: Supple, trachea midline  Lungs:  Clear to auscultation, normal effort  Heart: S1S2 irregular  Abdomen:  Soft, nontender, bowel sounds present  Extremities: 1+ peripheral edema.  Neurologic: Awake, alert, following commands  Skin: No lesions       Basic Metabolic Panel:  Recent Labs Lab 10/16/16 1543 10/17/16 0155 10/17/16 0804 10/18/16 0535 10/19/16 0543  NA 138 138  --  138 134*  K 3.6 3.3*  --  4.2 3.7  CL 104 102  --  101 98*  CO2 24 28  --  29 26  GLUCOSE 97 120*  --  94 101*  BUN 20 24*  --  25* 34*  CREATININE 2.36* 1.95*  --  1.54* 1.87*  CALCIUM 8.4* 8.4*  --  8.9 8.7*  MG  --   --  1.2*  --  1.8    Liver Function Tests:  Recent Labs Lab 10/16/16 1543 10/17/16 0155  AST 32 25  ALT 20 20  ALKPHOS 51 64  BILITOT 0.8 0.8  PROT 5.9* 6.0*  ALBUMIN 3.2* 3.2*   No results for input(s): LIPASE, AMYLASE in the last 168 hours.  Recent Labs Lab 10/16/16 1732  AMMONIA 29     CBC:  Recent Labs Lab 10/16/16 1543 10/19/16 0543  WBC 6.7 8.6  NEUTROABS 4.4  --   HGB 10.5* 10.8*  HCT 30.5* 30.8*  MCV 99.0 99.4  PLT 204 180    Cardiac Enzymes:  Recent Labs Lab 10/16/16 1543 10/16/16 2023 10/17/16 0155 10/17/16 0804  TROPONINI 0.04* 0.04* 0.04* 0.04*    BNP: Invalid input(s): POCBNP  CBG: No results for input(s): GLUCAP in the last 168 hours.  Microbiology: No results found for this or any previous visit.  Coagulation Studies:  Recent Labs  10/17/16 0804 10/19/16 0543  LABPROT 30.5* 23.8*  INR 2.85 2.09    Urinalysis:  Recent Labs  10/18/16 1556  COLORURINE YELLOW*  LABSPEC 1.006  PHURINE 5.0  GLUCOSEU NEGATIVE  HGBUR NEGATIVE  BILIRUBINUR NEGATIVE  KETONESUR NEGATIVE  PROTEINUR NEGATIVE  NITRITE NEGATIVE  LEUKOCYTESUR NEGATIVE      Imaging: Ct Head Wo Contrast  Result Date: 10/18/2016 CLINICAL DATA:  Confusion/altered mental status EXAM: CT HEAD WITHOUT CONTRAST TECHNIQUE: Contiguous axial images were obtained from the base of the skull through the vertex without intravenous contrast. COMPARISON:  October 22, 2015 FINDINGS: Brain: Mild to moderate diffuse atrophy is stable. There is no intracranial mass, hemorrhage,  extra-axial fluid collection, or midline shift. There is patchy small vessel disease throughout the centra semiovale bilaterally. Elsewhere gray-white compartments appear normal. No acute infarct evident. Vascular: There is no hyperdense vessel. There is calcification in each carotid siphon region. Skull: The bony calvarium appears intact. Sinuses/Orbits: There is mucosal thickening in several ethmoid air cells bilaterally. Other visualized paranasal sinuses are clear. Visualized orbits appear symmetric bilaterally. Other: Mastoids on the left are nearly aplastic. Aerated mastoid air cells bilaterally are clear. IMPRESSION: Stable atrophy with patchy periventricular small vessel disease. No intracranial mass,  hemorrhage, or extra-axial fluid collection. No acute infarct evident. Areas of arterial vascular calcification noted. Mucosal thickening in ethmoid air cells present bilaterally. Electronically Signed   By: Lowella Grip III M.D.   On: 10/18/2016 11:21     Medications:    . carvedilol  6.25 mg Oral BID WC  . finasteride  5 mg Oral Daily  . folic acid  1 mg Oral Daily  . hydrALAZINE  25 mg Oral TID  . isosorbide mononitrate  30 mg Oral Daily  . loratadine  10 mg Oral Daily  . mometasone-formoterol  2 puff Inhalation BID  . montelukast  10 mg Oral Daily  . multivitamin with minerals  1 tablet Oral Daily  . pantoprazole  40 mg Oral Daily  . pyridOXINE  50 mg Oral Daily  . risperiDONE  0.5 mg Oral QHS  . sertraline  50 mg Oral QHS  . simvastatin  20 mg Oral q1800  . tamsulosin  0.4 mg Oral Daily  . cyanocobalamin  500 mcg Oral Daily  . warfarin  2.5 mg Oral Once per day on Sun Fri Sat  . warfarin  5 mg Oral Once per day on Mon Tue Wed Thu  . Warfarin - Physician Dosing Inpatient   Does not apply q1800   acetaminophen, albuterol, diclofenac sodium, fluticasone, haloperidol lactate, meclizine, nitroGLYCERIN  Assessment/ Plan:  76 y.o. 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 Acute renal failure  2. Chronic kidney disease stage III. Baseline creatinine 1.36/GFR 50 on 07/20/2016 3. Anasarca  4. BPH  5. 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  Anemia of CKD.   Plan:  Diuretics have been stopped.  Agree with this as creatinine did increase up to 1.8.  Continue to monitor renal function closely.  Patient will likely need follow up of his renal function as an outpatient as well.  Otherwise for now continue supportive care and avoid nephrotoxins as possible.  LOS: 3 Dylan Faulkner 7/17/20184:26 PM

## 2016-10-19 NOTE — Care Management Important Message (Signed)
Important Message  Patient Details  Name: Dylan Faulkner MRN: [id number] Date of Birth: January 02, 1941   Medicare Important Message Given:  Yes Signed IM notice given  Katrina Stack, RN 10/19/2016, 4:24 PM

## 2016-10-19 NOTE — Clinical Social Work Note (Signed)
CSW received referral for SNF.  Case discussed with case manager and plan is to discharge home with home health.  CSW to sign off please re-consult if social work needs arise.  Tashima Scarpulla R. Tilia Faso, MSW, LCSWA [phone]  

## 2016-10-19 NOTE — Evaluation (Signed)
Occupational Therapy Evaluation Patient Details Name: Dylan Faulkner MRN: [id number] DOB: 1941/01/27 Today's Date: 10/19/2016    History of Present Illness Pt is a 76 y.o. M presented to ED 10/16/2016 with mild SOB, 10# weight gain over the past week, increased confusion and increased LE edema. Pt admitted 10/16/2016 with acute on chronic renal failure, acute on chronic CHF exacerbation, and a-fib RVR. PMH: neck fusion, back pain, BPH, CKD, Leukemia, vertigo, bicep repair, CHF, dementia with confusion, a-fib, asthma, HLD, HTN.    Clinical Impression   Pt seen for OT evaluation this date. Pt with dementia at baseline, per spouse report over the phone during evaluation, pt is constantly on the move, and has needed increased verbal cues to perform tasks such as shaving his face and occasional physical assist for LB dressing tasks due to SOB. Additionally, spouse endorses 2 falls in past 12 months (tripped and fell) as well as several near falls where a family member has caught him/stabilized him to prevent a fall. Spouse provides 24/7 supervision, however she is limited in her ability to help physically due to COPD and other medical issues. She has had increased concerns about being able to provide the needed level of supervision/assistance the pt needs at home independently. Spouse and family members eager to keep pt in the home. Spouse manages pt's medications, meals, and cleaning. Pt very pleasant, able to follow simple commands, occasional verbal cues needed for safety during functional mobility with min guard. No LOB noted, HR remained <110, O2 sats >93% throughout session. Still some mild swelling in BLE.   Pt will benefit from skilled OT services to maximize return to PLOF and minimize risk of future falls, injury, rehospitalization, and increased caregiver burden/higher level of care. Recommend home health OT services as well as 24/7 supervision. Pt/family would also highly benefit from a home health  aide to assist with meals, cleaning, and supervision for bathing/dressing tasks to minimize physical burden/stress on the spouse, as her health is not great, and at this point is not really able to provide the same level of assist.     Follow Up Recommendations  Home health OT;Supervision/Assistance - 24 hour    Equipment Recommendations  Toilet rise with handles    Recommendations for Other Services       Precautions / Restrictions Precautions Precautions: Fall Restrictions Weight Bearing Restrictions: No      Mobility Bed Mobility     General bed mobility comments: deferred, pt up in recliner for session  Transfers Overall transfer level: Needs assistance Equipment used: Rolling walker (2 wheeled) Transfers: Sit to/from Stand Sit to Stand: Min guard Stand pivot transfers: Min guard       General transfer comment: min guard and verbal cues and tactile/visual cues for hand placement to maximize safety, no posterior lean noted, slightly impulsive, requiring verbal cues     Balance Overall balance assessment: Needs assistance Sitting-balance support: Bilateral upper extremity supported;Feet supported Sitting balance-Leahy Scale: Good Sitting balance - Comments: static sitting balance occassionally reaching within base of support with no overt loss of balance   Standing balance support: Bilateral upper extremity supported Standing balance-Leahy Scale: Good Standing balance comment: no LOB noted during functional mobility, min guard                           ADL either performed or assessed with clinical judgement   ADL Overall ADL's : Needs assistance/impaired  Functional mobility during ADLs: Min guard;Rolling walker General ADL Comments: pt generally at supervision level for simple ADL tasks, occasional min asisst for LB ADL, min guard for functional mobility with RW and monitoring HR (generally between  90-110), O2 sats >93%     Vision Baseline Vision/History: No visual deficits Patient Visual Report: No change from baseline Vision Assessment?: No apparent visual deficits     Perception     Praxis      Pertinent Vitals/Pain Pain Assessment: No/denies pain     Hand Dominance Right   Extremity/Trunk Assessment Upper Extremity Assessment Upper Extremity Assessment: Overall WFL for tasks assessed   Lower Extremity Assessment Lower Extremity Assessment: Generalized weakness;Defer to PT evaluation RLE Deficits / Details: R hip abduction at least 3/5; R hip flexion 3+/5; R knee flexion/extension 5/5; R ankle DF at least 3/5 LLE Deficits / Details: L hip abduction at least 3/5; L hip flexion 3+/5; L knee flexion/extension 5/5; L ankle DF at least 3/5   Cervical / Trunk Assessment Cervical / Trunk Assessment: Normal   Communication Communication Communication: No difficulties   Cognition Arousal/Alertness: Awake/alert Behavior During Therapy: WFL for tasks assessed/performed Overall Cognitive Status: History of cognitive impairments - at baseline                                 General Comments: Pt with hx of dementia, oriented to self, date (because it's his brother's bday), place, and general situation; pleasant and cooperative and able to follow simple commands consistently, occasional verbal cues for safety   General Comments       Exercises Other Exercises Other Exercises: briefly provided some caregiver education to niece on dementia care, safety in the home, role of OT in the home, niece verbalized understanding   Shoulder Instructions      Home Living Family/patient expects to be discharged to:: Private residence Living Arrangements: Spouse/significant other Available Help at Discharge: Family Type of Home: House Home Access: Stairs to enter Technical brewer of Steps: 5 Entrance Stairs-Rails: Left;Right (can't reach both) Home Layout: One  level     Bathroom Shower/Tub: Teacher, early years/pre: Standard     Home Equipment: Environmental consultant - 2 wheels;Cane - single point;Bedside commode;Shower seat   Additional Comments: Wife (on the phone) indicated they *may* have a BSC and shower chair in storage. She plans to check.      Prior Functioning/Environment Level of Independence: Needs assistance  Gait / Transfers Assistance Needed: SPC for household activity, RW for community ambulation -- supervision always ADL's / Homemaking Assistance Needed: supervision for bathing, occasional assist for LB dressing; spouse reports pt sometimes forgets to do things (e.g., shave his face) and needs verbal cues to do it; spouse manages medications, cooking, and cleaning   Comments: spouse on phone reports 2 falls in past 12 months (tripped and fell) as well as several near falls where a family member has caught him/stabilized him to prevent a fall; pt always on the move        OT Problem List: Decreased cognition;Increased edema;Cardiopulmonary status limiting activity;Decreased strength;Decreased activity tolerance;Decreased safety awareness;Decreased knowledge of use of DME or AE      OT Treatment/Interventions: Self-care/ADL training;Therapeutic exercise;Therapeutic activities;Patient/family education;DME and/or AE instruction;Cognitive remediation/compensation    OT Goals(Current goals can be found in the care plan section) Acute Rehab OT Goals Patient Stated Goal: go home OT Goal Formulation: With patient/family Time For Goal Achievement:  11/02/16 Potential to Achieve Goals: Good  OT Frequency: Min 2X/week   Barriers to D/C:            Co-evaluation              AM-PAC PT "6 Clicks" Daily Activity     Outcome Measure Help from another person eating meals?: None Help from another person taking care of personal grooming?: None Help from another person toileting, which includes using toliet, bedpan, or urinal?: A  Little Help from another person bathing (including washing, rinsing, drying)?: A Little Help from another person to put on and taking off regular upper body clothing?: None Help from another person to put on and taking off regular lower body clothing?: A Little 6 Click Score: 21   End of Session Equipment Utilized During Treatment: Rolling walker;Gait belt  Activity Tolerance: Patient tolerated treatment well Patient left: in chair;with call bell/phone within reach;with chair alarm set;with family/visitor present  OT Visit Diagnosis: Other abnormalities of gait and mobility (R26.89);Repeated falls (R29.6);Muscle weakness (generalized) (M62.81);Other symptoms and signs involving cognitive function                Time: 8119-1478 OT Time Calculation (min): 41 min Charges:  OT General Charges $OT Visit: 1 Procedure OT Evaluation $OT Eval Low Complexity: 1 Procedure OT Treatments $Self Care/Home Management : 8-22 mins G-Codes:     Jeni Salles, MPH, MS, OTR/L ascom [phone] 10/19/16, 2:59 PM

## 2016-10-20 ENCOUNTER — Inpatient Hospital Stay: Payer: Medicare HMO

## 2016-10-20 DIAGNOSIS — R410 Disorientation, unspecified: Secondary | ICD-10-CM

## 2016-10-20 LAB — BASIC METABOLIC PANEL
Anion gap: 10 (ref 5–15)
BUN: 34 mg/dL — AB (ref 6–20)
CO2: 26 mmol/L (ref 22–32)
CREATININE: 1.61 mg/dL — AB (ref 0.61–1.24)
Calcium: 8.8 mg/dL — ABNORMAL LOW (ref 8.9–10.3)
Chloride: 99 mmol/L — ABNORMAL LOW (ref 101–111)
GFR, EST AFRICAN AMERICAN: 47 mL/min — AB (ref >60)
GFR, EST NON AFRICAN AMERICAN: 40 mL/min — AB (ref >60)
Glucose, Bld: 126 mg/dL — ABNORMAL HIGH (ref 65–99)
POTASSIUM: 3.4 mmol/L — AB (ref 3.5–5.1)
SODIUM: 135 mmol/L (ref 135–145)

## 2016-10-20 LAB — PROTIME-INR
INR: 2.28
Prothrombin Time: 25.5 seconds — ABNORMAL HIGH (ref 11.4–15.2)

## 2016-10-20 LAB — SEDIMENTATION RATE: SED RATE: 105 mm/h — AB (ref 0–20)

## 2016-10-20 MED ORDER — QUETIAPINE FUMARATE 50 MG PO TABS: 50.0000 mg | ORAL_TABLET | Freq: Every day | ORAL | 2 refills | Status: AC

## 2016-10-20 MED ORDER — QUETIAPINE FUMARATE 25 MG PO TABS: 50.0000 mg | ORAL_TABLET | Freq: Every day | ORAL | Status: DC

## 2016-10-20 NOTE — Progress Notes (Signed)
IV and tele removed from patient. Discharge instructions given to patient and wife. Verbalized understanding. No distress at this time. Wife is at bedside and grandson will be transporting patient home.

## 2016-10-20 NOTE — Care Management (Signed)
Patient for discharge home today.  Notified [facility] and obtained order for SN PT Aide and SW.

## 2016-10-20 NOTE — Progress Notes (Signed)
[facility] [place] NAME: Dylan Faulkner    MR#:  [id number]  DATE OF BIRTH:  1940/12/26  SUBJECTIVE:  CHIEF COMPLAINT:   Chief Complaint  Patient presents with  . fluid retention   - alert and pleasant in AM, agitated again last night. Going on for almost a year now- worse recently - no complaints now  REVIEW OF SYSTEMS:  Review of Systems  Constitutional: Negative for chills, fever and malaise/fatigue.  HENT: Negative for congestion, ear discharge, hearing loss and nosebleeds.   Respiratory: Negative for cough, shortness of breath and wheezing.   Cardiovascular: Positive for leg swelling. Negative for chest pain and palpitations.  Gastrointestinal: Negative for abdominal pain, constipation, diarrhea, nausea and vomiting.  Genitourinary: Negative for dysuria.  Musculoskeletal: Positive for myalgias.       Muscle cramps  Neurological: Negative for dizziness, seizures and headaches.    DRUG ALLERGIES:  No Known Allergies  VITALS:  Blood pressure (!) 145/80, pulse 100, temperature 98.4 F (36.9 C), temperature source Oral, resp. rate 17, height 5\' 8"  (1.727 m), weight 79.1 kg (174 lb 4.8 oz), SpO2 96 %.  PHYSICAL EXAMINATION:  Physical Exam  GENERAL:  76 y.o.-year-old patient lying in the bed with no acute distress.  EYES: Pupils equal, round, reactive to light and accommodation. No scleral icterus. Extraocular muscles intact.  HEENT: Head atraumatic, normocephalic. Oropharynx and nasopharynx clear.  NECK:  Supple, no jugular venous distention. No thyroid enlargement, no tenderness.  LUNGS: Normal breath sounds bilaterally, no wheezing, rales rhonchi or crepitation. No use of accessory muscles of respiration.  CARDIOVASCULAR: S1, S2 normal. No murmurs, rubs, or gallops.  ABDOMEN: Soft, nontender, nondistended. Bowel sounds present. No organomegaly or mass.  EXTREMITIES: No cyanosis, or clubbing. 1+ bilateral pedal  edema NEUROLOGIC: Cranial nerves II through XII are intact.Has slurred speech Muscle strength 5/5 in all extremities. Sensation intact. Gait not checked. Global weakness noted PSYCHIATRIC: The patient is alert and oriented x 1-2.  SKIN: No obvious rash, lesion, or ulcer.    LABORATORY PANEL:   CBC  Recent Labs Lab 10/19/16 0543  WBC 8.6  HGB 10.8*  HCT 30.8*  PLT 180   ------------------------------------------------------------------------------------------------------------------  Chemistries   Recent Labs Lab 10/17/16 0155  10/19/16 0543 10/20/16 1128  NA 138  < > 134* 135  K 3.3*  < > 3.7 3.4*  CL 102  < > 98* 99*  CO2 28  < > 26 26  GLUCOSE 120*  < > 101* 126*  BUN 24*  < > 34* 34*  CREATININE 1.95*  < > 1.87* 1.61*  CALCIUM 8.4*  < > 8.7* 8.8*  MG  --   < > 1.8  --   AST 25  --   --   --   ALT 20  --   --   --   ALKPHOS 64  --   --   --   BILITOT 0.8  --   --   --   < > = values in this interval not displayed. ------------------------------------------------------------------------------------------------------------------  Cardiac Enzymes  Recent Labs Lab 10/17/16 0804  TROPONINI 0.04*   ------------------------------------------------------------------------------------------------------------------  RADIOLOGY:  No results found.  EKG:   Orders placed or performed during the hospital encounter of 10/16/16  . ED EKG  . ED EKG  . EKG 12-Lead  . EKG 12-Lead  . EKG 12-Lead  . EKG 12-Lead    ASSESSMENT AND PLAN:   76 year old male with  past medical history significant for diastolic CHF, atrial fibrillation on warfarin, hypertension, BPH, mild dementia presents to hospital secondary to worsening confusion and anasarca.  #1 anasarca-acute on chronic diastolic CHF exacerbation. -Has increased right-sided pressures and pulmonary hypertension. -Untreated sleep apnea, patient has not been using his CPAP and also increased salt intake at  home. -Received IV Lasix  And restarted back on torsemide- will hold for now as renal function slight worse. - restart at a lower dose at discharge. F/u with nephrology as outpatient -improved anasarca -Daily weights, strict input and output monitoring  #2 metabolic encephalopathy- sun downing from progressive dementia with behavioural disturbances per wife- becoming aggressive as well at home. Worsened much since his trauma and head bleed last July -Ammonia within normal limits. According to wife, confusion is improving. -hold gabapentin for now, d/c flexeril. delirium at night- add seroquel at discharge - CT head and UA normal- no acute abnormalities. H/o head bleed- no encephalomalacia - Appreciate neurology consult  #3 acute renal failure on CKD-baseline creatinine around 1.4, creatinine greater than 2 on admission. -Received IV Lasix in the ER, and restarted back on torsemide- hold now and decrease dose at discharge as slight worsening in creatinine noted. Continue to follow up with nephrology as outpatient.. -minimal proteinuria, improving creatinine  #4 chronic A. fib-rate . Patient on Coreg. Continue warfarin for anticoagulation.   #5 DVT prophylaxis-on warfarin   PT recommended home health   All the records are reviewed and case discussed with Care Management/Social Workerr. Management plans discussed with the patient, family and they are in agreement.  CODE STATUS: Full code  TOTAL TIME TAKING CARE OF THIS PATIENT: 36 minutes.   POSSIBLE D/C TODAY, DEPENDING ON CLINICAL CONDITION.   Gladstone Lighter M.D on 10/20/2016 at 12:03 PM  Between 7am to 6pm - Pager - [phone]  After 6pm go to [email or web address] - password [email or web address] [facility]  Office  [phone]  CC: Primary care physician; Donnie Coffin, MD

## 2016-10-20 NOTE — Progress Notes (Signed)
[facility]  ROUNDING NOTE   Subjective:  Patient with periods of agitation overnight. No new renal function testing this a.m. Urine output measured yesterday was 550 cc however unclear as to whether this was accurate.  Objective:  Vital signs in last 24 hours:  Temp:  [97.5 F (36.4 C)-100 F (37.8 C)] 98.4 F (36.9 C) (07/18 0840) Pulse Rate:  [43-115] 100 (07/18 0840) Resp:  [1-20] 17 (07/18 0840) BP: (134-158)/(80-91) 145/80 (07/18 0840) SpO2:  [95 %-98 %] 96 % (07/18 0840) Weight:  [79.1 kg (174 lb 4.8 oz)] 79.1 kg (174 lb 4.8 oz) (07/18 0433)  Weight change: -0.318 kg (-11.2 oz) Filed Weights   10/18/16 0431 10/19/16 0525 10/20/16 0433  Weight: 79.1 kg (174 lb 6 oz) 79.4 kg (175 lb) 79.1 kg (174 lb 4.8 oz)    Intake/Output: I/O last 3 completed shifts: In: 480 [P.O.:480] Out: 1450 [Urine:1450]   Intake/Output this shift:  Total I/O In: -  Out: 150 [Urine:150]  Physical Exam: General: No acute distress  Head: Normocephalic, atraumatic. Moist oral mucosal membranes  Eyes: Anicteric  Neck: Supple, trachea midline  Lungs:  Clear to auscultation, normal effort  Heart: S1S2 irregular  Abdomen:  Soft, nontender, bowel sounds present  Extremities: 1+ peripheral edema.  Neurologic: Awake, alert, following commands  Skin: No lesions       Basic Metabolic Panel:  Recent Labs Lab 10/16/16 1543 10/17/16 0155 10/17/16 0804 10/18/16 0535 10/19/16 0543  NA 138 138  --  138 134*  K 3.6 3.3*  --  4.2 3.7  CL 104 102  --  101 98*  CO2 24 28  --  29 26  GLUCOSE 97 120*  --  94 101*  BUN 20 24*  --  25* 34*  CREATININE 2.36* 1.95*  --  1.54* 1.87*  CALCIUM 8.4* 8.4*  --  8.9 8.7*  MG  --   --  1.2*  --  1.8    Liver Function Tests:  Recent Labs Lab 10/16/16 1543 10/17/16 0155  AST 32 25  ALT 20 20  ALKPHOS 51 64  BILITOT 0.8 0.8  PROT 5.9* 6.0*  ALBUMIN 3.2* 3.2*   No results for input(s): LIPASE, AMYLASE in the last 168  hours.  Recent Labs Lab 10/16/16 1732  AMMONIA 29    CBC:  Recent Labs Lab 10/16/16 1543 10/19/16 0543  WBC 6.7 8.6  NEUTROABS 4.4  --   HGB 10.5* 10.8*  HCT 30.5* 30.8*  MCV 99.0 99.4  PLT 204 180    Cardiac Enzymes:  Recent Labs Lab 10/16/16 1543 10/16/16 2023 10/17/16 0155 10/17/16 0804  TROPONINI 0.04* 0.04* 0.04* 0.04*    BNP: Invalid input(s): POCBNP  CBG: No results for input(s): GLUCAP in the last 168 hours.  Microbiology: No results found for this or any previous visit.  Coagulation Studies:  Recent Labs  10/19/16 0543 10/20/16 0454  LABPROT 23.8* 25.5*  INR 2.09 2.28    Urinalysis:  Recent Labs  10/18/16 1556  COLORURINE YELLOW*  LABSPEC 1.006  PHURINE 5.0  GLUCOSEU NEGATIVE  HGBUR NEGATIVE  BILIRUBINUR NEGATIVE  KETONESUR NEGATIVE  PROTEINUR NEGATIVE  NITRITE NEGATIVE  LEUKOCYTESUR NEGATIVE      Imaging: Ct Head Wo Contrast  Result Date: 10/18/2016 CLINICAL DATA:  Confusion/altered mental status EXAM: CT HEAD WITHOUT CONTRAST TECHNIQUE: Contiguous axial images were obtained from the base of the skull through the vertex without intravenous contrast. COMPARISON:  October 22, 2015 FINDINGS: Brain: Mild to moderate  diffuse atrophy is stable. There is no intracranial mass, hemorrhage, extra-axial fluid collection, or midline shift. There is patchy small vessel disease throughout the centra semiovale bilaterally. Elsewhere gray-white compartments appear normal. No acute infarct evident. Vascular: There is no hyperdense vessel. There is calcification in each carotid siphon region. Skull: The bony calvarium appears intact. Sinuses/Orbits: There is mucosal thickening in several ethmoid air cells bilaterally. Other visualized paranasal sinuses are clear. Visualized orbits appear symmetric bilaterally. Other: Mastoids on the left are nearly aplastic. Aerated mastoid air cells bilaterally are clear. IMPRESSION: Stable atrophy with patchy  periventricular small vessel disease. No intracranial mass, hemorrhage, or extra-axial fluid collection. No acute infarct evident. Areas of arterial vascular calcification noted. Mucosal thickening in ethmoid air cells present bilaterally. Electronically Signed   By: Lowella Grip III M.D.   On: 10/18/2016 11:21     Medications:    . carvedilol  6.25 mg Oral BID WC  . finasteride  5 mg Oral Daily  . folic acid  1 mg Oral Daily  . hydrALAZINE  25 mg Oral TID  . isosorbide mononitrate  30 mg Oral Daily  . loratadine  10 mg Oral Daily  . mometasone-formoterol  2 puff Inhalation BID  . montelukast  10 mg Oral Daily  . multivitamin with minerals  1 tablet Oral Daily  . pantoprazole  40 mg Oral Daily  . pyridOXINE  50 mg Oral Daily  . risperiDONE  0.5 mg Oral QHS  . sertraline  50 mg Oral QHS  . simvastatin  20 mg Oral q1800  . tamsulosin  0.4 mg Oral Daily  . cyanocobalamin  500 mcg Oral Daily  . warfarin  2.5 mg Oral Once per day on Sun Fri Sat  . warfarin  5 mg Oral Once per day on Mon Tue Wed Thu  . Warfarin - Physician Dosing Inpatient   Does not apply q1800   acetaminophen, albuterol, diclofenac sodium, fluticasone, haloperidol lactate, meclizine, nitroGLYCERIN  Assessment/ Plan:  76 y.o. 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 Acute renal failure  2. Chronic kidney disease stage III. Baseline creatinine 1.36/GFR 50 on 07/20/2016 3. Anasarca  4. BPH  5. 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  Anemia of CKD.   Plan:  Continue to hold diuretics at this time. Follow-up renal function later today. Patient has had some periods of agitation for which he's required Haldol.  Avoid nephrotoxins as possible including NSAIDs and IV dye while patient is in renal recovery. We will continue to monitor his progress over  time.    LOS: 4 Arial Galligan 7/18/201811:10 AM

## 2016-10-20 NOTE — Discharge Instructions (Signed)
[facility] Failure [facility] appointment on November 01, 2016 at 12:00pm with Darylene Price, [place]. Please call [phone] to reschedule.

## 2016-10-20 NOTE — Progress Notes (Signed)
Patient was impulsive for most of the night, required multiple redirect .PRN  Haldol was administered twice for acute agitations. Patient was in asymptomatic  afib, maintained stable VS. Wife was at bedside.
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
## 2016-10-21 LAB — RPR: RPR: NONREACTIVE

## 2016-10-21 NOTE — Discharge Summary (Signed)
[facility] NAME: Dylan Faulkner    MR#:  [id number]  DATE OF BIRTH:  Dec 24, 1940  DATE OF ADMISSION:  10/16/2016   ADMITTING PHYSICIAN: Baxter Hire, MD  DATE OF DISCHARGE: 10/20/2016  7:25 PM  PRIMARY CARE PHYSICIAN: Donnie Coffin, MD   ADMISSION DIAGNOSIS:   Peripheral edema [R60.9] Acute on chronic renal insufficiency [N28.9, N18.9] Rapid atrial fibrillation ([place]) [I48.91] Acute on chronic systolic congestive heart failure ([place]) [I50.23]  DISCHARGE DIAGNOSIS:   Active Problems:   Acute on chronic renal failure ([place])   Confusion   SECONDARY DIAGNOSIS:   Past Medical History:  Diagnosis Date  . A-fib ([place])   . Asthma   . Atrial fibrillation ([place])   . BPH (benign prostatic hyperplasia)   . CHF (congestive heart failure) ([place])   . Enlarged prostate   . Heart failure ([place])   . Hyperlipidemia   . Hypertension   . 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 Renal insufficiency   . Vertigo     HOSPITAL COURSE:   76 year old male with past medical history significant for diastolic CHF, atrial fibrillation on warfarin, hypertension, BPH, mild dementia presents to hospital secondary to worsening confusion and anasarca.  #1 anasarca-acute on chronic diastolic CHF exacerbation. -Has increased right-sided pressures and pulmonary hypertension. -Untreated sleep apnea, patient has not been using his CPAP and also increased salt intake at home. -Received IV Lasix  And restarted back on torsemide-  at a lower dose at discharge. F/u with nephrology as outpatient -improved anasarca  #2 metabolic encephalopathy- sun downing from progressive dementia with behavioural disturbances per wife- becoming aggressive as well at home. -d/c hold gabapentin for now, d/c flexeril. delirium at night- added seroquel at discharge - CT head and UA normal- no acute abnormalities. H/o head bleed- no encephalomalacia - Appreciate neurology consult.  EEG normal without any epileptic discharges. Follow up with neurology as outpatient  #3 acute renal failure on CKD-baseline creatinine around 1.4, creatinine greater than 2 on admission. -Received IV Lasix in the ER, and restarted back on torsemide- at a lower dose at discharge due to slight bump in creatinine. . Continue to follow up with nephrology as outpatient.. -minimal proteinuria  #4 chronic A. fib-rate . Patient on Coreg. Continue warfarin for anticoagulation.    PT recommended home health-will be discharged with home health today  DISCHARGE CONDITIONS:   Guarded  CONSULTS OBTAINED:   Treatment Team:  Murlean Iba, MD Alexis Goodell, MD  DRUG ALLERGIES:   No Known Allergies DISCHARGE MEDICATIONS:   Allergies as of 10/20/2016   No Known Allergies     Medication List    STOP taking these medications   cyclobenzaprine 10 MG tablet Commonly known as:  FLEXERIL   furosemide 20 MG tablet Commonly known as:  LASIX   gabapentin 600 MG tablet Commonly known as:  NEURONTIN   lisinopril 2.5 MG tablet Commonly known as:  PRINIVIL,ZESTRIL     TAKE these medications   acetaminophen 500 MG tablet Commonly known as:  TYLENOL Take 1,000 mg by mouth every 6 (six) hours as needed.   albuterol 108 (90 Base) MCG/ACT inhaler Commonly known as:  PROVENTIL HFA;VENTOLIN HFA Inhale 2 puffs into the lungs every 6 (six) hours as needed for wheezing or shortness of breath.   carvedilol 6.25 MG tablet Commonly known as:  COREG Take 1 tablet (6.25 mg total) by mouth 2 (two) times daily with a  meal. What changed:  medication strength  how much to take   cyanocobalamin 500 MCG tablet Take 500 mcg by mouth daily.   finasteride 5 MG tablet Commonly known as:  PROSCAR Take 5 mg by mouth daily.   fluticasone 50 MCG/ACT nasal spray Commonly known as:  FLONASE Place 2 sprays into both nostrils daily as needed for allergies or rhinitis.   Fluticasone-Salmeterol  250-50 MCG/DOSE Aepb Commonly known as:  ADVAIR Inhale 1 puff into the lungs 2 (two) times daily.   folic acid 1 MG tablet Commonly known as:  FOLVITE Take 1 mg by mouth daily.   hydrALAZINE 25 MG tablet Commonly known as:  APRESOLINE Take 25 mg by mouth 3 (three) times daily.   isosorbide mononitrate 30 MG 24 hr tablet Commonly known as:  IMDUR Take 1 tablet (30 mg total) by mouth daily.   loratadine 10 MG tablet Commonly known as:  CLARITIN Take 10 mg by mouth daily.   meclizine 25 MG tablet Commonly known as:  ANTIVERT Take 25 mg by mouth 3 (three) times daily as needed for dizziness.   montelukast 10 MG tablet Commonly known as:  SINGULAIR Take 10 mg by mouth daily.   multivitamin with minerals Tabs tablet Take 1 tablet by mouth daily.   nitroGLYCERIN 0.4 MG SL tablet Commonly known as:  NITROSTAT Place 0.4 mg under the tongue every 5 (five) minutes as needed for chest pain.   omeprazole 20 MG capsule Commonly known as:  PRILOSEC Take 20 mg by mouth daily.   Potassium Chloride ER 20 MEQ Tbcr Take 20 mEq by mouth daily.   pyridOXINE 50 MG tablet Commonly known as:  VITAMIN B-6 Take 50 mg by mouth daily.   QUEtiapine 50 MG tablet Commonly known as:  SEROQUEL Take 1 tablet (50 mg total) by mouth at bedtime.   sertraline 50 MG tablet Commonly known as:  ZOLOFT Take 50 mg by mouth at bedtime.   simvastatin 20 MG tablet Commonly known as:  ZOCOR Take 20 mg by mouth daily at 6 PM.   tamsulosin 0.4 MG Caps capsule Commonly known as:  FLOMAX Take 0.4 mg by mouth daily.   torsemide 20 MG tablet Commonly known as:  DEMADEX Take 1 tablet (20 mg total) by mouth daily. What changed:  when to take this   traMADol 50 MG tablet Commonly known as:  ULTRAM Take by mouth every 6 (six) hours as needed.   warfarin 5 MG tablet Commonly known as:  COUMADIN Take 2.5-5 mg by mouth daily. Take 1 tab Monday-Thursday and 0.5 tab Friday-Sunday        DISCHARGE  INSTRUCTIONS:   1. Neurology follow-up in 1-2 weeks 2. PCP follow-up in 1-2 weeks  DIET:   Cardiac diet  ACTIVITY:   Activity as tolerated  OXYGEN:   Home Oxygen: No.  Oxygen Delivery: room air  DISCHARGE LOCATION:   home   If you experience worsening of your admission symptoms, develop shortness of breath, life threatening emergency, suicidal or homicidal thoughts you must seek medical attention immediately by calling 911 or calling your MD immediately  if symptoms less severe.  You Must read complete instructions/literature along with all the possible adverse reactions/side effects for all the Medicines you take and that have been prescribed to you. Take any new Medicines after you have completely understood and accpet all the possible adverse reactions/side effects.   Please note  You were cared for by a hospitalist during your hospital stay. If  you have any questions about your discharge medications or the care you received while you were in the hospital after you are discharged, you can call the unit and asked to speak with the hospitalist on call if the hospitalist that took care of you is not available. Once you are discharged, your primary care physician will handle any further medical issues. Please note that NO REFILLS for any discharge medications will be authorized once you are discharged, as it is imperative that you return to your primary care physician (or establish a relationship with a primary care physician if you do not have one) for your aftercare needs so that they can reassess your need for medications and monitor your lab values.    On the day of Discharge:  VITAL SIGNS:   Blood pressure (!) 158/95, pulse 90, temperature 98.4 F (36.9 C), temperature source Oral, resp. rate 16, height 5\' 8"  (1.727 m), weight 79.1 kg (174 lb 4.8 oz), SpO2 96 %.  PHYSICAL EXAMINATION:   GENERAL:  76 y.o.-year-old patient lying in the bed with no acute distress.  EYES:  Pupils equal, round, reactive to light and accommodation. No scleral icterus. Extraocular muscles intact.  HEENT: Head atraumatic, normocephalic. Oropharynx and nasopharynx clear.  NECK:  Supple, no jugular venous distention. No thyroid enlargement, no tenderness.  LUNGS: Normal breath sounds bilaterally, no wheezing, rales rhonchi or crepitation. No use of accessory muscles of respiration.  CARDIOVASCULAR: S1, S2 normal. No murmurs, rubs, or gallops.  ABDOMEN: Soft, nontender, nondistended. Bowel sounds present. No organomegaly or mass.  EXTREMITIES: No cyanosis, or clubbing. 1+ bilateral pedal edema NEUROLOGIC: Cranial nerves II through XII are intact.Has slurred speech Muscle strength 5/5 in all extremities. Sensation intact. Gait not checked. Global weakness noted PSYCHIATRIC: The patient is alert and oriented x 1-2.  SKIN: No obvious rash, lesion, or ulcer.   DATA REVIEW:   CBC  Recent Labs Lab 10/19/16 0543  WBC 8.6  HGB 10.8*  HCT 30.8*  PLT 180    Chemistries   Recent Labs Lab 10/17/16 0155  10/19/16 0543 10/20/16 1128  NA 138  < > 134* 135  K 3.3*  < > 3.7 3.4*  CL 102  < > 98* 99*  CO2 28  < > 26 26  GLUCOSE 120*  < > 101* 126*  BUN 24*  < > 34* 34*  CREATININE 1.95*  < > 1.87* 1.61*  CALCIUM 8.4*  < > 8.7* 8.8*  MG  --   < > 1.8  --   AST 25  --   --   --   ALT 20  --   --   --   ALKPHOS 64  --   --   --   BILITOT 0.8  --   --   --   < > = values in this interval not displayed.   Microbiology Results  No results found for this or any previous visit.  RADIOLOGY:  No results found.   Management plans discussed with the patient, family and they are in agreement.  CODE STATUS:  Code Status History    Date Active Date Inactive Code Status Order ID Comments User Context   10/16/2016  8:17 PM 10/21/2016 12:35 AM Full Code [id number]  Baxter Hire, MD Inpatient   07/20/2016  8:44 PM 07/22/2016  3:36 PM Full Code [id number]  Loletha Grayer, MD ED    10/22/2015  4:30 PM 10/24/2015  2:01 PM Full Code [id number]  Dustin Flock,  MD ED   05/16/2015  3:42 AM 05/16/2015  3:49 PM Full Code [id number]  Harrie Foreman, MD Inpatient      TOTAL TIME TAKING CARE OF THIS PATIENT: 38 minutes.    Gladstone Lighter M.D on 10/21/2016 at 4:33 PM  Between 7am to 6pm - Pager - [phone]  After 6pm go to [email or web address] - [employer] [facility]  Office  [phone]  CC: Primary care physician; Donnie Coffin, MD   Note: This dictation was prepared with Dragon dictation along with smaller phrase technology. Any transcriptional errors that result from this process are unintentional.

## 2016-10-25 ENCOUNTER — Emergency Department
Admission: EM | Admit: 2016-10-25 | Discharge: 2016-10-25 | Disposition: A | Discharge status: Home / Self Care | Payer: Medicare HMO | Attending: Emergency Medicine | Admitting: Emergency Medicine

## 2016-10-25 ENCOUNTER — Emergency Department: Payer: Medicare HMO

## 2016-10-25 DIAGNOSIS — I10 Essential (primary) hypertension: Secondary | ICD-10-CM | POA: Insufficient documentation

## 2016-10-25 DIAGNOSIS — Z79899 Other long term (current) drug therapy: Secondary | ICD-10-CM | POA: Diagnosis not present

## 2016-10-25 DIAGNOSIS — J45909 Unspecified asthma, uncomplicated: Secondary | ICD-10-CM | POA: Diagnosis not present

## 2016-10-25 DIAGNOSIS — R0602 Shortness of breath: Secondary | ICD-10-CM | POA: Diagnosis present

## 2016-10-25 DIAGNOSIS — Z7901 Long term (current) use of anticoagulants: Secondary | ICD-10-CM | POA: Insufficient documentation

## 2016-10-25 DIAGNOSIS — I509 Heart failure, unspecified: Secondary | ICD-10-CM | POA: Insufficient documentation

## 2016-10-25 LAB — BASIC METABOLIC PANEL
Anion gap: 9 (ref 5–15)
BUN: 19 mg/dL (ref 6–20)
CALCIUM: 8.8 mg/dL — AB (ref 8.9–10.3)
CO2: 26 mmol/L (ref 22–32)
CREATININE: 1.75 mg/dL — AB (ref 0.61–1.24)
Chloride: 101 mmol/L (ref 101–111)
GFR calc non Af Amer: 37 mL/min — ABNORMAL LOW (ref >60)
GFR, EST AFRICAN AMERICAN: 42 mL/min — AB (ref >60)
Glucose, Bld: 97 mg/dL (ref 65–99)
Potassium: 3.5 mmol/L (ref 3.5–5.1)
SODIUM: 136 mmol/L (ref 135–145)

## 2016-10-25 LAB — CBC
HCT: 30.3 % — ABNORMAL LOW (ref 40.0–52.0)
Hemoglobin: 10.6 g/dL — ABNORMAL LOW (ref 13.0–18.0)
MCH: 34.8 pg — AB (ref 26.0–34.0)
MCHC: 35 g/dL (ref 32.0–36.0)
MCV: 99.4 fL (ref 80.0–100.0)
PLATELETS: 276 10*3/uL (ref 150–440)
RBC: 3.05 MIL/uL — AB (ref 4.40–5.90)
RDW: 15.1 % — AB (ref 11.5–14.5)
WBC: 8.7 10*3/uL (ref 3.8–10.6)

## 2016-10-25 LAB — TROPONIN I: TROPONIN I: 0.04 ng/mL — AB (ref <0.03)

## 2016-10-25 LAB — BRAIN NATRIURETIC PEPTIDE: B Natriuretic Peptide: 578 pg/mL — ABNORMAL HIGH (ref 0.0–100.0)

## 2016-10-25 MED ORDER — ALBUTEROL SULFATE (2.5 MG/3ML) 0.083% IN NEBU
5.0000 mg | INHALATION_SOLUTION | Freq: Once | RESPIRATORY_TRACT | Status: AC
  Administered 2016-10-25: 5 mg via RESPIRATORY_TRACT
  Filled 2016-10-25: qty 6

## 2016-10-25 MED ORDER — FUROSEMIDE 10 MG/ML IJ SOLN
INTRAMUSCULAR | Status: AC
  Filled 2016-10-25: qty 4

## 2016-10-25 MED ORDER — FUROSEMIDE 10 MG/ML IJ SOLN
40.0000 mg | Freq: Once | INTRAMUSCULAR | Status: AC
  Administered 2016-10-25: 40 mg via INTRAVENOUS

## 2016-10-25 NOTE — ED Notes (Signed)
D/C Wed for pumonary edema, brought in by EMS via w/cwith no distress noted from home for persistent SHOB, HR 110, 98% RA FS 115; left a/c 18GA SL; contact Neoma Laming (wife) [phone]

## 2016-10-25 NOTE — ED Triage Notes (Addendum)
Pt arrives to ED from home via [facility] with c/o shortness of breath. Pt denies any c/o CP, dizziness, weakness, or fainting. No reports of fever, no N/V/D. Pt reports h/x of COPD, CHF, and Afib; did not attempt to use inhaler at home prior to calling EMS. Pt arrives A&O, in NAD, RR even, regular, and unlabored. Breath sounds clear bilaterally to auscultation, skin is WPD. Pt arrives with 18G PIV in the LEFT AC inserted by EMS PTA. Pt reports compliance with prescribed medications.

## 2016-10-25 NOTE — Discharge Instructions (Signed)
Please seek medical attention for any high fevers, chest pain, shortness of breath, change in behavior, persistent vomiting, bloody stool or any other new or concerning symptoms.  

## 2016-10-25 NOTE — ED Provider Notes (Signed)
[facility] Emergency Department Provider Note   ____________________________________________   I have reviewed the triage vital signs and the nursing notes.   HISTORY  Chief Complaint Shortness of Breath   History limited by: Not Limited   HPI Dylan Faulkner is a 76 y.o. male who presents to the emergency department today because of concerns for difficulty with breathing and edema. The patient does have a history of CHF. Patient was hospitalized and discharged 5 days ago. Since being home family states that his swelling has gotten worse. They have noticed primarily in his abdomen is lower extremities. In addition patient was having difficulty breathing earlier today. At the time my examination they state that his breathing has improved. He has not had any chest pain. No fevers.  Past Medical History:  Diagnosis Date  . A-fib ([place])   . Asthma   . Atrial fibrillation ([place])   . BPH (benign prostatic hyperplasia)   . CHF (congestive heart failure) ([place])   . Enlarged prostate   . Heart failure ([place])   . Hyperlipidemia   . Hypertension   . Leukemia ([place])    20 years ago  . Renal insufficiency   . Vertigo     Patient Active Problem List   Diagnosis Date Noted  . Confusion   . Acute on chronic renal failure ([place]) 10/16/2016  . Syncope 05/16/2015    Past Surgical History:  Procedure Laterality Date  . BICEPS TENDON REPAIR    . HERNIA REPAIR    . neck fusion      Prior to Admission medications   Medication Sig Start Date End Date Taking? Authorizing Provider  acetaminophen (TYLENOL) 500 MG tablet Take 1,000 mg by mouth every 6 (six) hours as needed.    [provider]  albuterol (PROVENTIL HFA;VENTOLIN HFA) 108 (90 Base) MCG/ACT inhaler Inhale 2 puffs into the lungs every 6 (six) hours as needed for wheezing or shortness of breath. 03/26/16   Rudene Re, MD  carvedilol (COREG) 6.25 MG tablet Take 1 tablet (6.25 mg total) by mouth  2 (two) times daily with a meal. 10/19/16   Gladstone Lighter, MD  cyanocobalamin 500 MCG tablet Take 500 mcg by mouth daily.    [provider]  finasteride (PROSCAR) 5 MG tablet Take 5 mg by mouth daily.    [provider]  fluticasone (FLONASE) 50 MCG/ACT nasal spray Place 2 sprays into both nostrils daily as needed for allergies or rhinitis.    [provider]  Fluticasone-Salmeterol (ADVAIR) 250-50 MCG/DOSE AEPB Inhale 1 puff into the lungs 2 (two) times daily.    [provider]  folic acid (FOLVITE) 1 MG tablet Take 1 mg by mouth daily.    [provider]  hydrALAZINE (APRESOLINE) 25 MG tablet Take 25 mg by mouth 3 (three) times daily.    [provider]  isosorbide mononitrate (IMDUR) 30 MG 24 hr tablet Take 1 tablet (30 mg total) by mouth daily. 05/16/15   Bettey Costa, MD  loratadine (CLARITIN) 10 MG tablet Take 10 mg by mouth daily.    [provider]  meclizine (ANTIVERT) 25 MG tablet Take 25 mg by mouth 3 (three) times daily as needed for dizziness.    [provider]  montelukast (SINGULAIR) 10 MG tablet Take 10 mg by mouth daily.    [provider]  Multiple Vitamin (MULTIVITAMIN WITH MINERALS) TABS tablet Take 1 tablet by mouth daily.    [provider]  nitroGLYCERIN (  NITROSTAT) 0.4 MG SL tablet Place 0.4 mg under the tongue every 5 (five) minutes as needed for chest pain.    [provider]  omeprazole (PRILOSEC) 20 MG capsule Take 20 mg by mouth daily.    [provider]  Potassium Chloride ER 20 MEQ TBCR Take 20 mEq by mouth daily.     [provider]  pyridOXINE (VITAMIN B-6) 50 MG tablet Take 50 mg by mouth daily.    [provider]  QUEtiapine (SEROQUEL) 50 MG tablet Take 1 tablet (50 mg total) by mouth at bedtime. 10/20/16   Gladstone Lighter, MD  sertraline (ZOLOFT) 50 MG tablet Take 50 mg by mouth at bedtime.     [provider]  simvastatin  (ZOCOR) 20 MG tablet Take 20 mg by mouth daily at 6 PM.     [provider]  tamsulosin (FLOMAX) 0.4 MG CAPS capsule Take 0.4 mg by mouth daily.     [provider]  torsemide (DEMADEX) 20 MG tablet Take 1 tablet (20 mg total) by mouth daily. 10/19/16   Gladstone Lighter, MD  traMADol (ULTRAM) 50 MG tablet Take by mouth every 6 (six) hours as needed.    [provider]  warfarin (COUMADIN) 5 MG tablet Take 2.5-5 mg by mouth daily. Take 1 tab Monday-Thursday and 0.5 tab Friday-Sunday    [provider]    Allergies Patient has no known allergies.  Family History  Problem Relation Age of Onset  . Hypertension Unknown   . Dementia Mother   . Kidney failure Father   . Ovarian cancer Sister   . Lung cancer Brother     Social History Social History  Substance Use Topics  . Smoking status: Never Smoker  . Smokeless tobacco: Never Used  . Alcohol use No    Review of Systems Constitutional: No fever/chills Eyes: No visual changes. ENT: No sore throat. Cardiovascular: Denies chest pain. Respiratory: Positive for shortness of breath. Gastrointestinal: No abdominal pain.  No nausea, no vomiting.  No diarrhea.   Genitourinary: Negative for dysuria. Musculoskeletal: Positive for leg swelling.  Skin: Negative for rash. Neurological: Negative for headaches, focal weakness or numbness.  ____________________________________________   PHYSICAL EXAM:  VITAL SIGNS: ED Triage Vitals  Enc Vitals Group     BP 10/25/16 1945 (!) 150/100     Pulse Rate 10/25/16 1945 (!) 109     Resp 10/25/16 1945 18     Temp 10/25/16 1945 98.6 F (37 C)     Temp Source 10/25/16 1945 Oral     SpO2 10/25/16 1945 97 %     Weight 10/25/16 1942 170 lb (77.1 kg)     Height 10/25/16 1942 5\' 10"  (1.778 m)     Head Circumference --      Peak Flow --      Pain Score 10/25/16 1942 0   Constitutional: Alert and oriented. Well appearing and in no distress. Eyes: Conjunctivae  are normal.  ENT   Head: Normocephalic and atraumatic.   Nose: No congestion/rhinnorhea.   Mouth/Throat: Mucous membranes are moist.   Neck: No stridor. Hematological/Lymphatic/Immunilogical: No cervical lymphadenopathy. Cardiovascular: Normal rate, regular rhythm.  No murmurs, rubs, or gallops.  Respiratory: Normal respiratory effort without tachypnea nor retractions. Breath sounds are clear and equal bilaterally. No wheezes/rales/rhonchi. Gastrointestinal: Soft and non tender. No rebound. No guarding.  Genitourinary: Deferred Musculoskeletal: Normal range of motion in all extremities. No lower extremity edema. Neurologic:  Normal speech and language. No gross focal  neurologic deficits are appreciated.  Skin:  Skin is warm, dry and intact. No rash noted. Psychiatric: Mood and affect are normal. Speech and behavior are normal. Patient exhibits appropriate insight and judgment.  ____________________________________________    LABS (pertinent positives/negatives)  Labs Reviewed  BASIC METABOLIC PANEL - Abnormal; Notable for the following:       Result Value   Creatinine, Ser 1.75 (*)    Calcium 8.8 (*)    GFR calc non Af Amer 37 (*)    GFR calc Af Amer 42 (*)    All other components within normal limits  CBC - Abnormal; Notable for the following:    RBC 3.05 (*)    Hemoglobin 10.6 (*)    HCT 30.3 (*)    MCH 34.8 (*)    RDW 15.1 (*)    All other components within normal limits  TROPONIN I - Abnormal; Notable for the following:    Troponin I 0.04 (*)    All other components within normal limits  BRAIN NATRIURETIC PEPTIDE - Abnormal; Notable for the following:    B Natriuretic Peptide 578.0 (*)    All other components within normal limits     ____________________________________________   EKG  I, Nance Pear, attending physician, personally viewed and interpreted this EKG  EKG Time: 1953 Rate: 89 Rhythm: atrial fibrillation with PVC Axis: left axis  deviation Intervals: qtc 459 QRS: narrow, q waves V1, V2 ST changes: no st elevation Impression: abnormal ekg   ____________________________________________    RADIOLOGY  CXR FINDINGS:  Cardiac shadow is at the upper limits of normal in size but stable.  Postsurgical changes are seen in the cervical spine. The lungs are  well aerated bilaterally. No scarring is noted in the left mid lung.  No sizable infiltrate or effusion is noted.    ____________________________________________   PROCEDURES  Procedures  ____________________________________________   INITIAL IMPRESSION / ASSESSMENT AND PLAN / ED COURSE  Pertinent labs & imaging results that were available during my care of the patient were reviewed by me and considered in my medical decision making (see chart for details).  Patient presented to the emergency department today because of concerns for swelling and shortness of breath. Patient does have a history of CHF. Patient however no acute distress upon my examination. Will give patient some IV Lasix. We will reassess.  Patient was able to ambulate without difficulty. Will give heart failure follow up information. Patient states he has appointment with PCP tomorrow. ____________________________________________   FINAL CLINICAL IMPRESSION(S) / ED DIAGNOSES  Final diagnoses:  Congestive heart failure, unspecified HF chronicity, unspecified heart failure type [facility])     Note: This dictation was prepared with Dragon dictation. Any transcriptional errors that result from this process are unintentional     Nance Pear, MD 10/27/16 1740

## 2016-11-01 ENCOUNTER — Ambulatory Visit: Payer: Medicare HMO | Admitting: Family

## 2016-11-01 ENCOUNTER — Telehealth: Payer: Self-pay | Admitting: Family

## 2016-11-01 NOTE — Telephone Encounter (Signed)
Patient did not show for his Heart Failure Clinic appointment on 11/01/16. Will attempt to reschedule.   Of note, this is his 4th appointment that he hasn't shown up for.

## 2017-03-30 IMAGING — CT CT ANGIO CHEST
2 of 7 series · 18 of 36 positions shown · IV contrast (APPLIED)
Comparison: Plain films of earlier today and CT of 10/22/2015.

CLINICAL DATA: Chest pain.  Hypoxia.

EXAM:
CT ANGIOGRAPHY CHEST WITH CONTRAST
TECHNIQUE: Multidetector CT imaging of the chest was performed using the
standard protocol during bolus administration of intravenous
contrast. Multiplanar CT image reconstructions and MIPs were
obtained to evaluate the vascular anatomy.
CONTRAST:  60 cc of Isovue 370

[Series 5: thins · axial · 0.73mm/px · z∈[-753,-476]mm · 15 of 317 slices shown]
[im 20/317  lung]
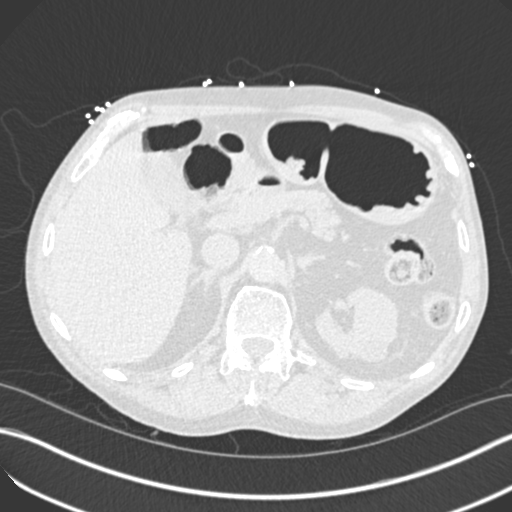
[im 40/317  mediastinal]
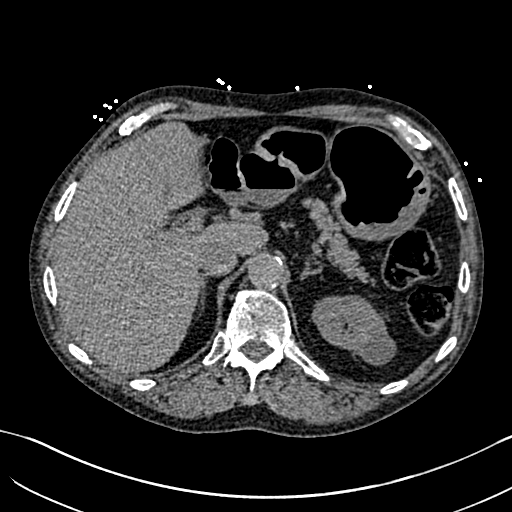
[im 60/317  lung]
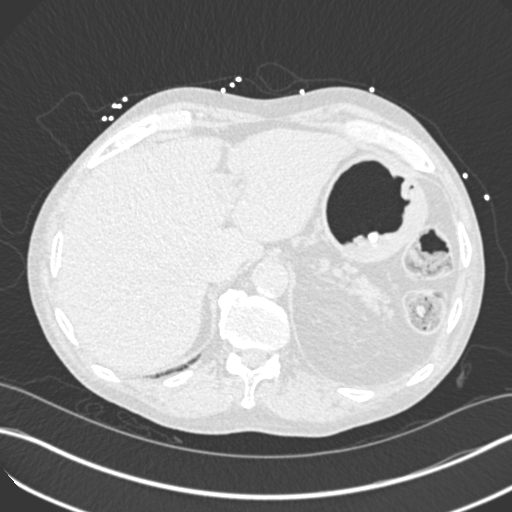
[im 80/317  mediastinal]
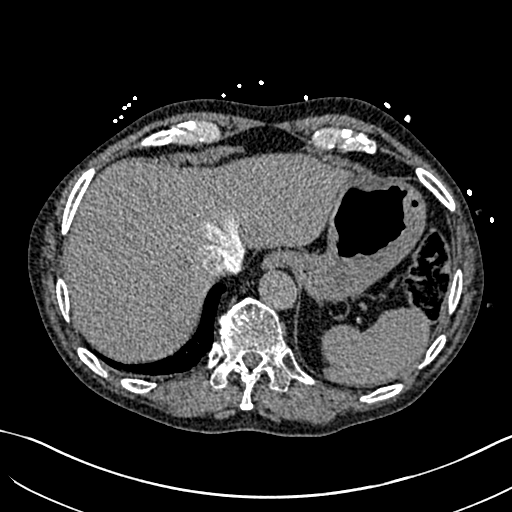
[im 99/317  lung]
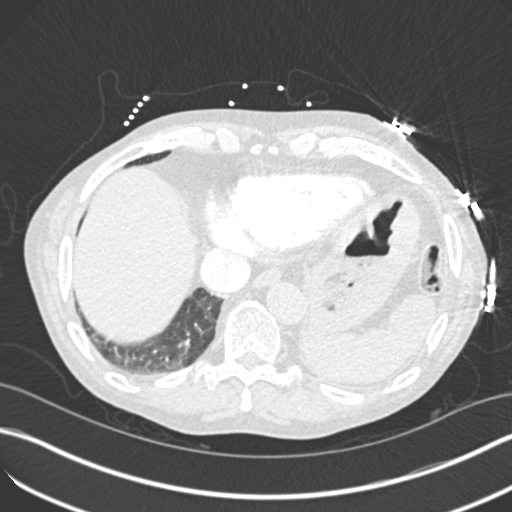
[im 119/317  mediastinal]
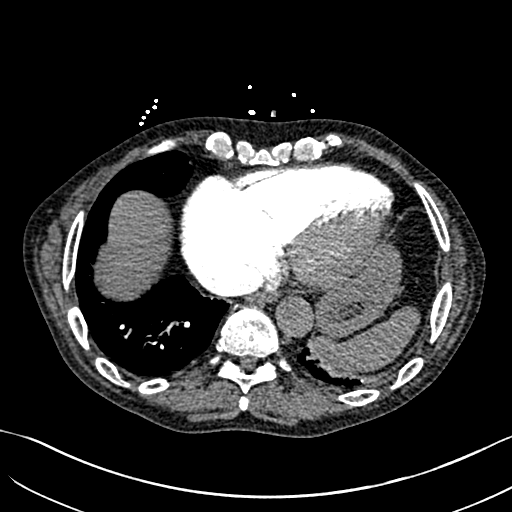
[im 139/317  lung]
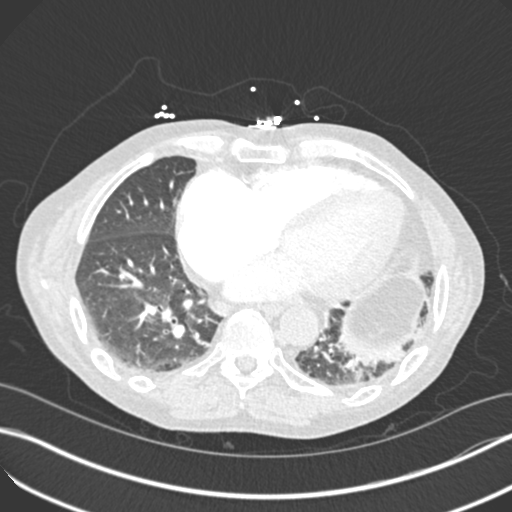
[im 159/317  mediastinal]
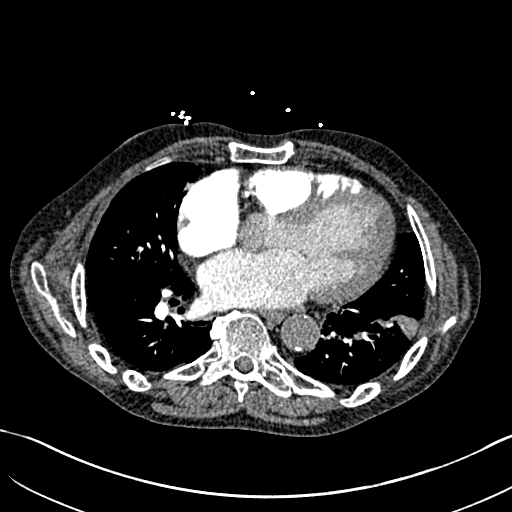
[im 178/317  lung]
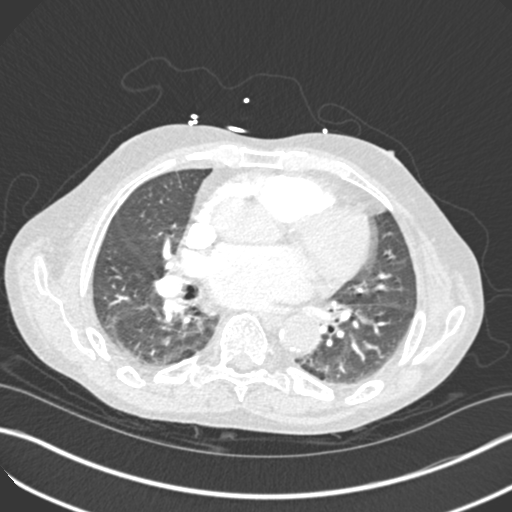
[im 198/317  mediastinal]
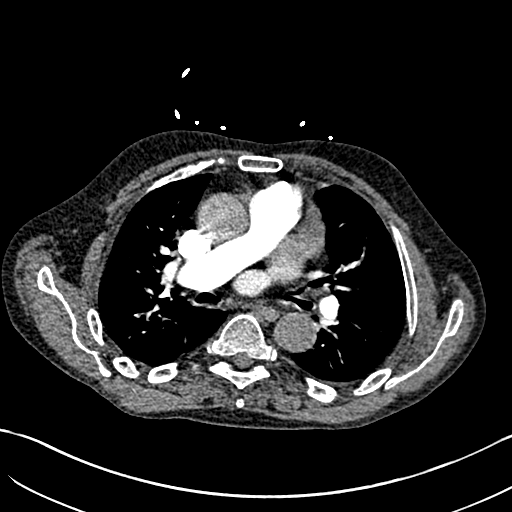
[im 218/317  lung]
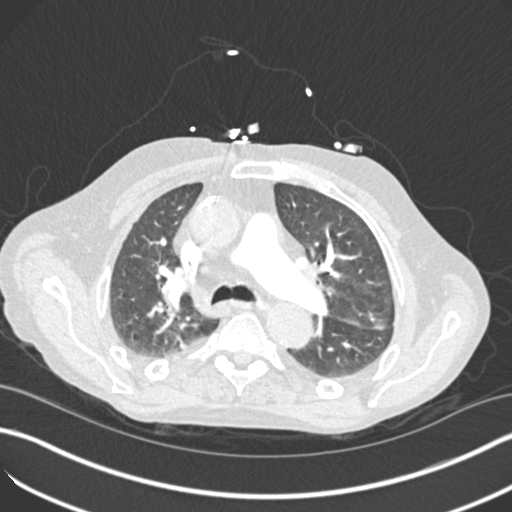
[im 238/317  mediastinal]
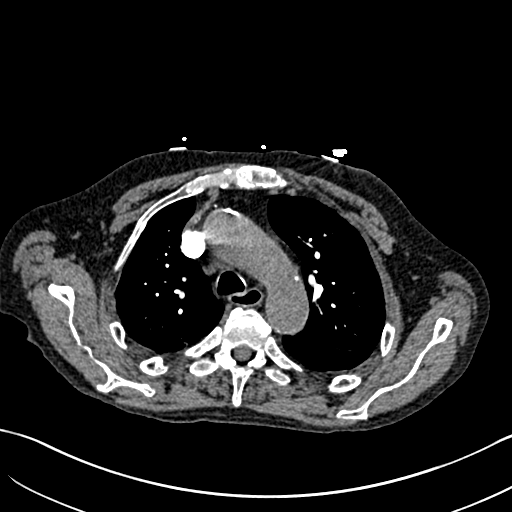
[im 257/317  lung]
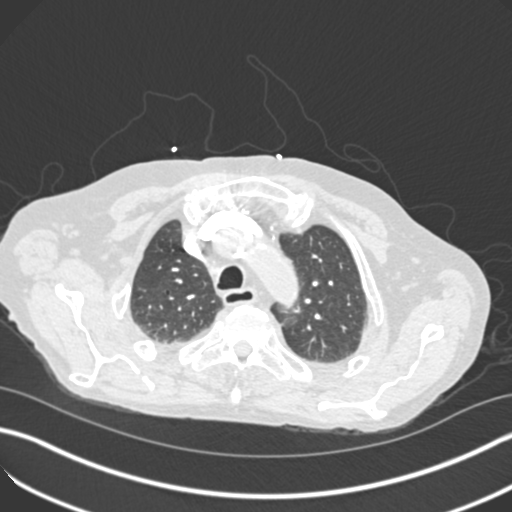
[im 277/317  mediastinal]
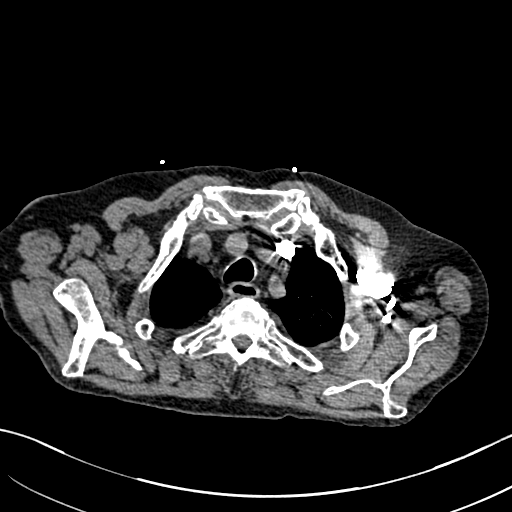
[im 297/317  lung]
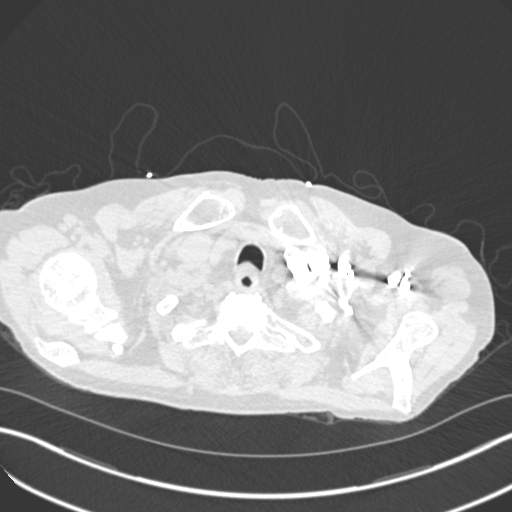

[Series 6: lung · axial · 0.73mm/px · z∈[-660,-525]mm · 3 of 91 slices shown]
[im 23/91  mediastinal]
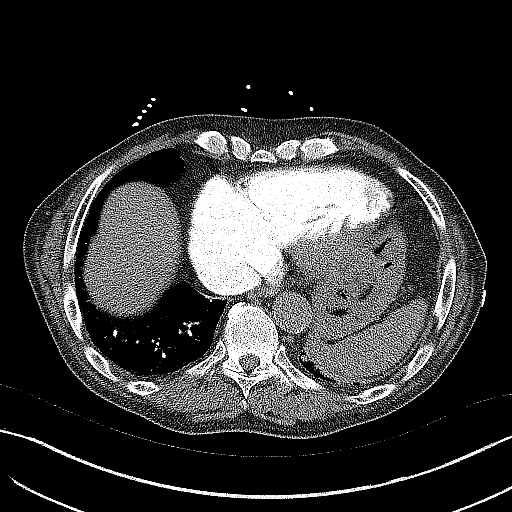
[im 46/91  mediastinal]
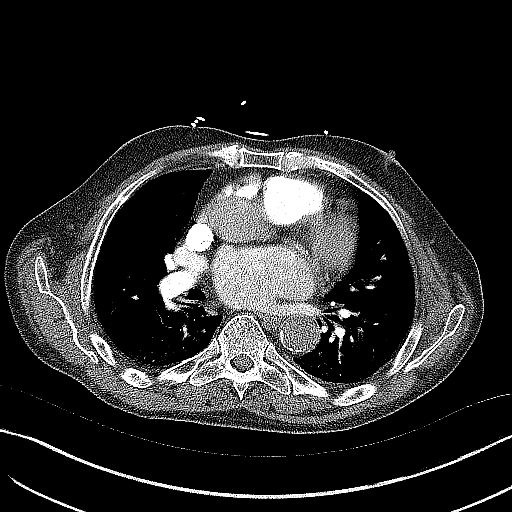
[im 68/91  mediastinal]
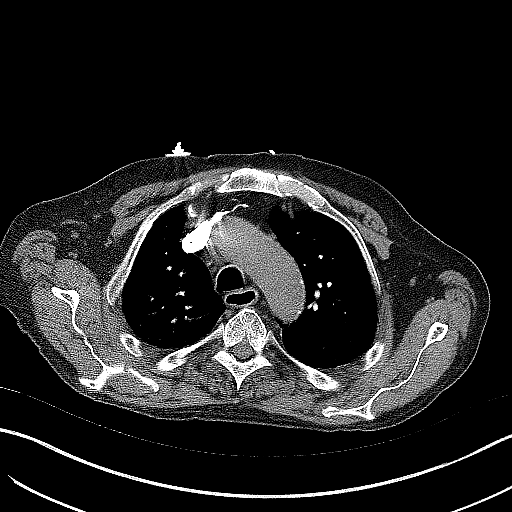

[18 of 36 positions shown; findings below may reference images not displayed]

FINDINGS: Cardiovascular: The quality of this exam for evaluation of pulmonary
embolism is good. The bolus is well timed. No evidence of pulmonary
embolism.

Aortic and branch vessel atherosclerosis. Tortuous thoracic aorta.
Moderate cardiomegaly with right sided chamber enlargement.
Multivessel coronary artery atherosclerosis. Contrast reflux into
the IVC suggesting elevated right heart pressures. Pulmonary artery
enlargement with outflow tract of 3.3 cm.

Mediastinum/Nodes: No mediastinal or hilar adenopathy.

Lungs/Pleura: No pleural fluid. Mild motion and artifact degradation
throughout (EKG leads and wires). Posterior upper lobe scarring
bilaterally. Patchy left lower lobe airspace disease laterally.

Upper Abdomen: Normal imaged portions of the liver, spleen, stomach,
pancreas, gallbladder, adrenal glands. Low-density left renal
lesions are likely cysts.

Musculoskeletal: Nonacute right scapular fracture without glenoid
extension. Lower cervical spine fixation. Left hemidiaphragm
elevation.

Review of the MIP images confirms the above findings.
IMPRESSION: 1.  No evidence of pulmonary embolism.
2. Left lower lobe opacity is adjacent to left hemidiaphragm
elevation and favored to represent atelectasis. Early infection felt
less likely.
3. Cardiomegaly with elevated right heart pressures. Pulmonary
artery enlargement suggests pulmonary arterial hypertension.
4.  Coronary artery atherosclerosis. Aortic atherosclerosis.

## 2017-05-09 IMAGING — CR DG CHEST 2V
1 series · 2 of 2 positions shown · non-contrast
Comparison: 06/10/2016

CLINICAL DATA: Worsening shortness of breath over the last day.

EXAM:
CHEST  2 VIEW

[Series 1: dg chest 2 view · 0.14mm/px · 2 of 2 slices shown]
[im 1/2]
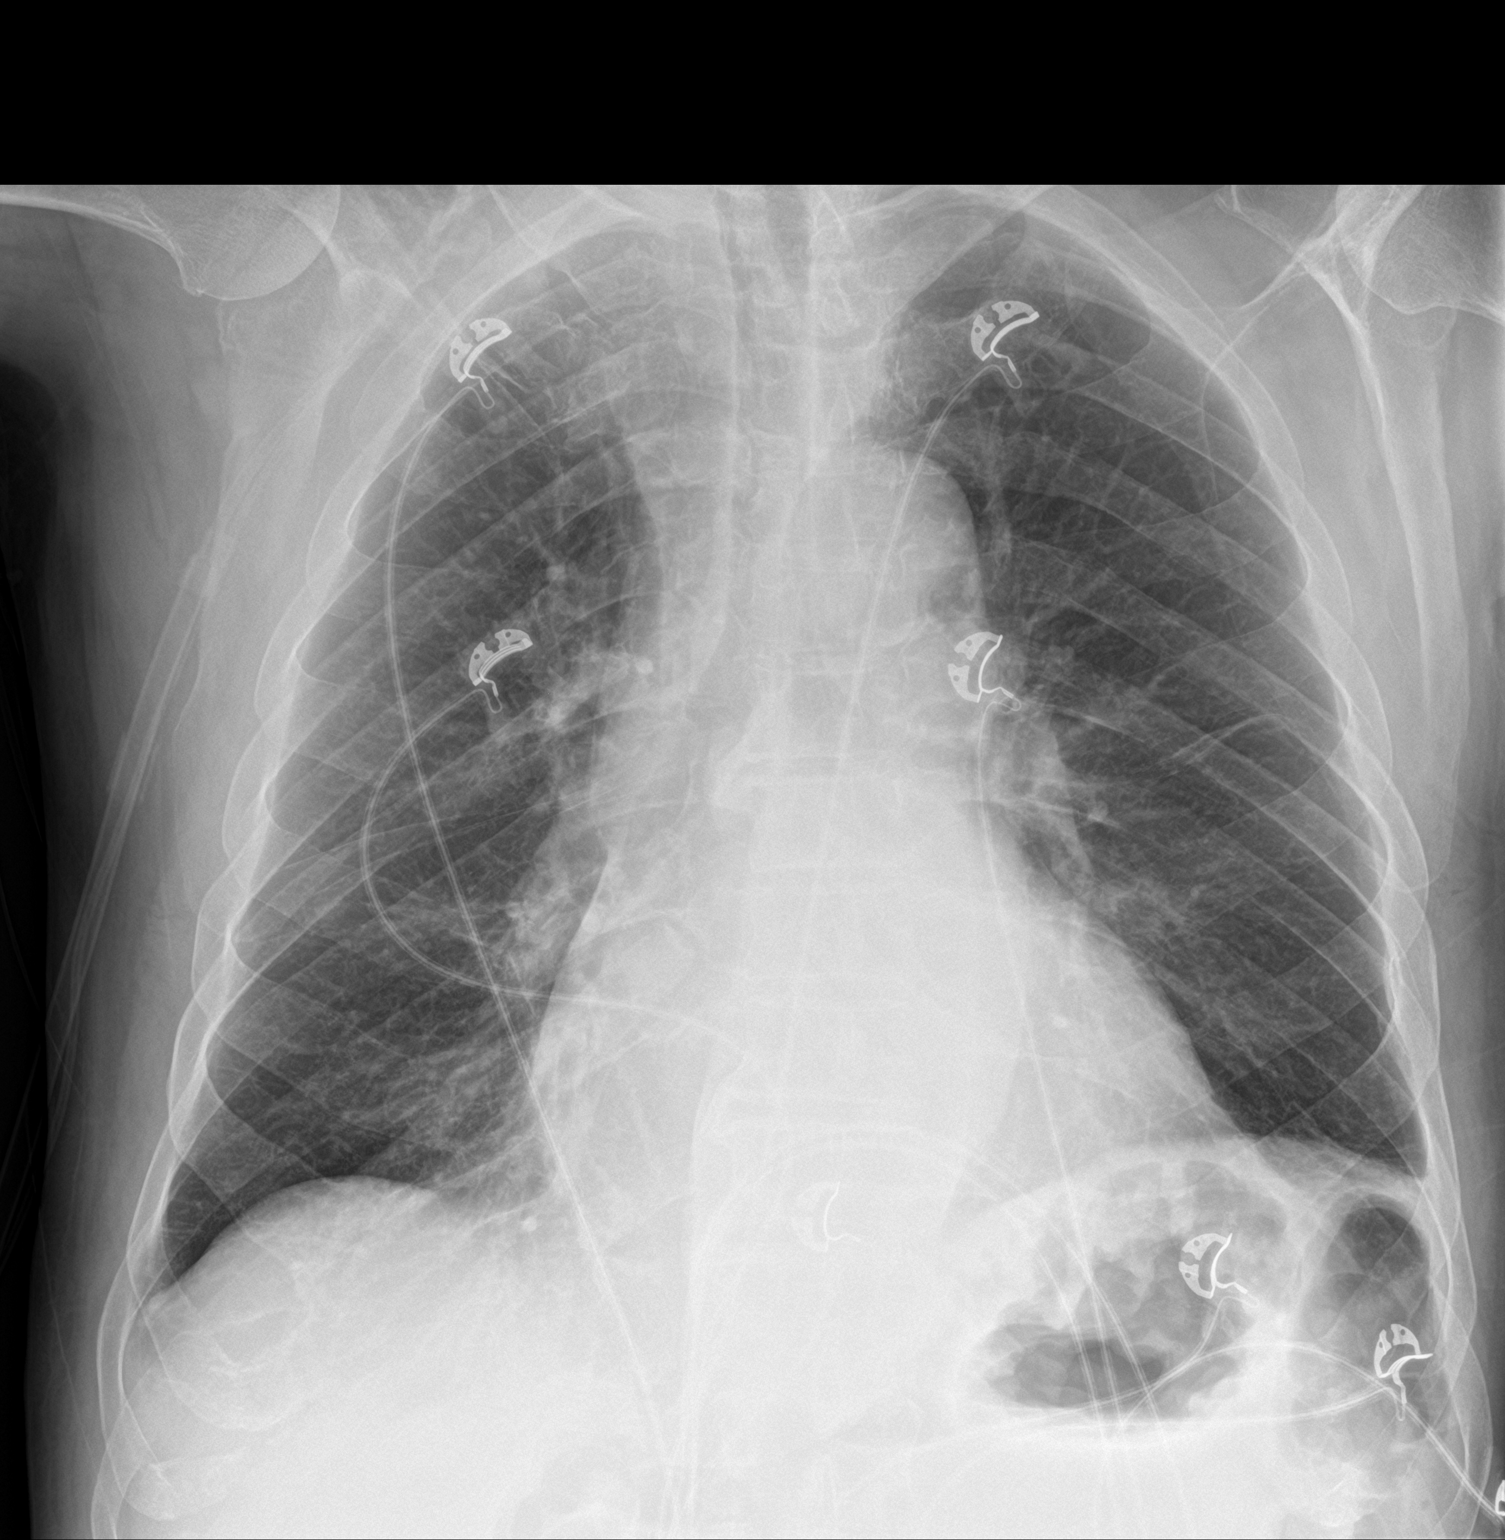
[im 2/2]
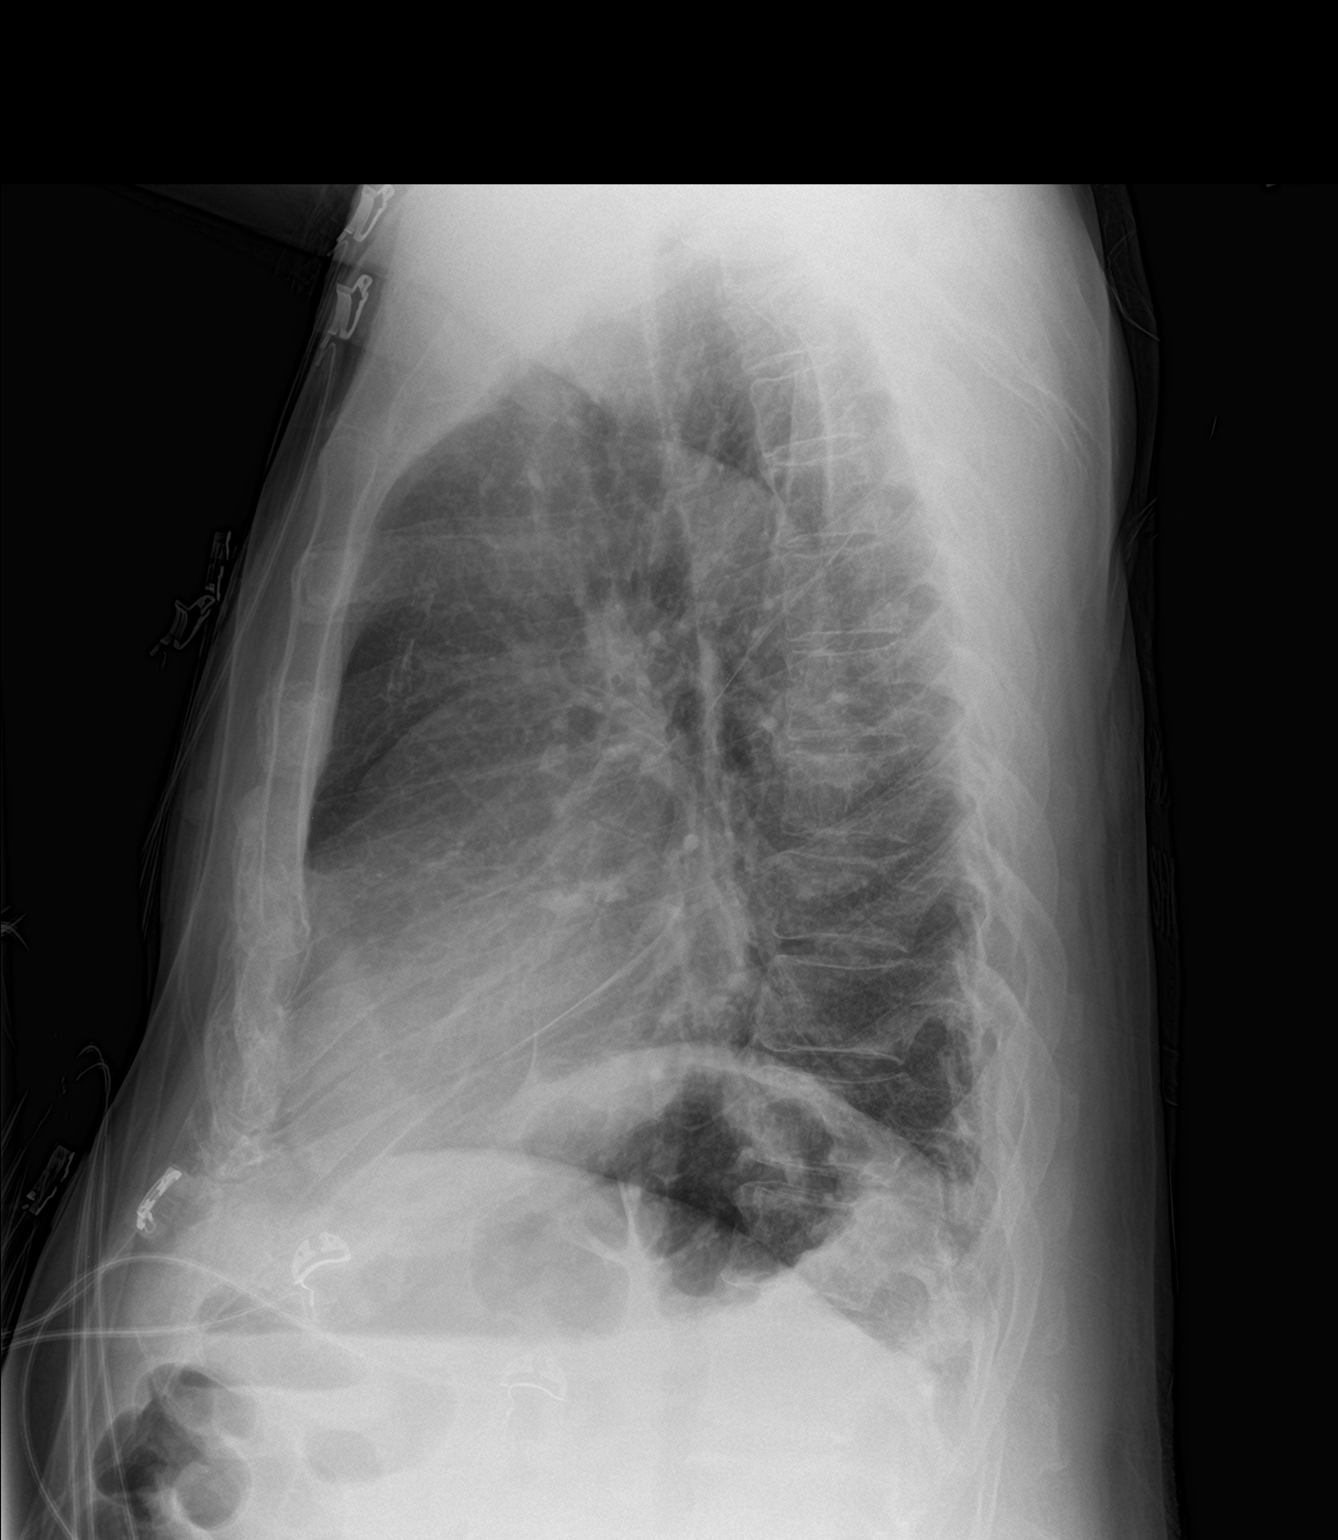

[2 of 2 positions shown; findings below may reference images not displayed]

FINDINGS: Chronic cardiomegaly. Chronic aortic atherosclerosis. The lungs are
clear except for mild chronic scarring at the left base. No sign of
infiltrate, mass, effusion or collapse. No significant bone finding.
IMPRESSION: No active process evident. Mild cardiomegaly. Aortic
atherosclerosis. Chronic left base scarring.

## 2017-08-14 IMAGING — CR DG CHEST 2V
1 series · 2 of 2 positions shown · non-contrast
Comparison: 10/16/16

CLINICAL DATA: Shortness of Breath

EXAM:
CHEST  2 VIEW

[Series 1: dg chest 2 view · 0.14mm/px · 2 of 2 slices shown]
[im 1/2]
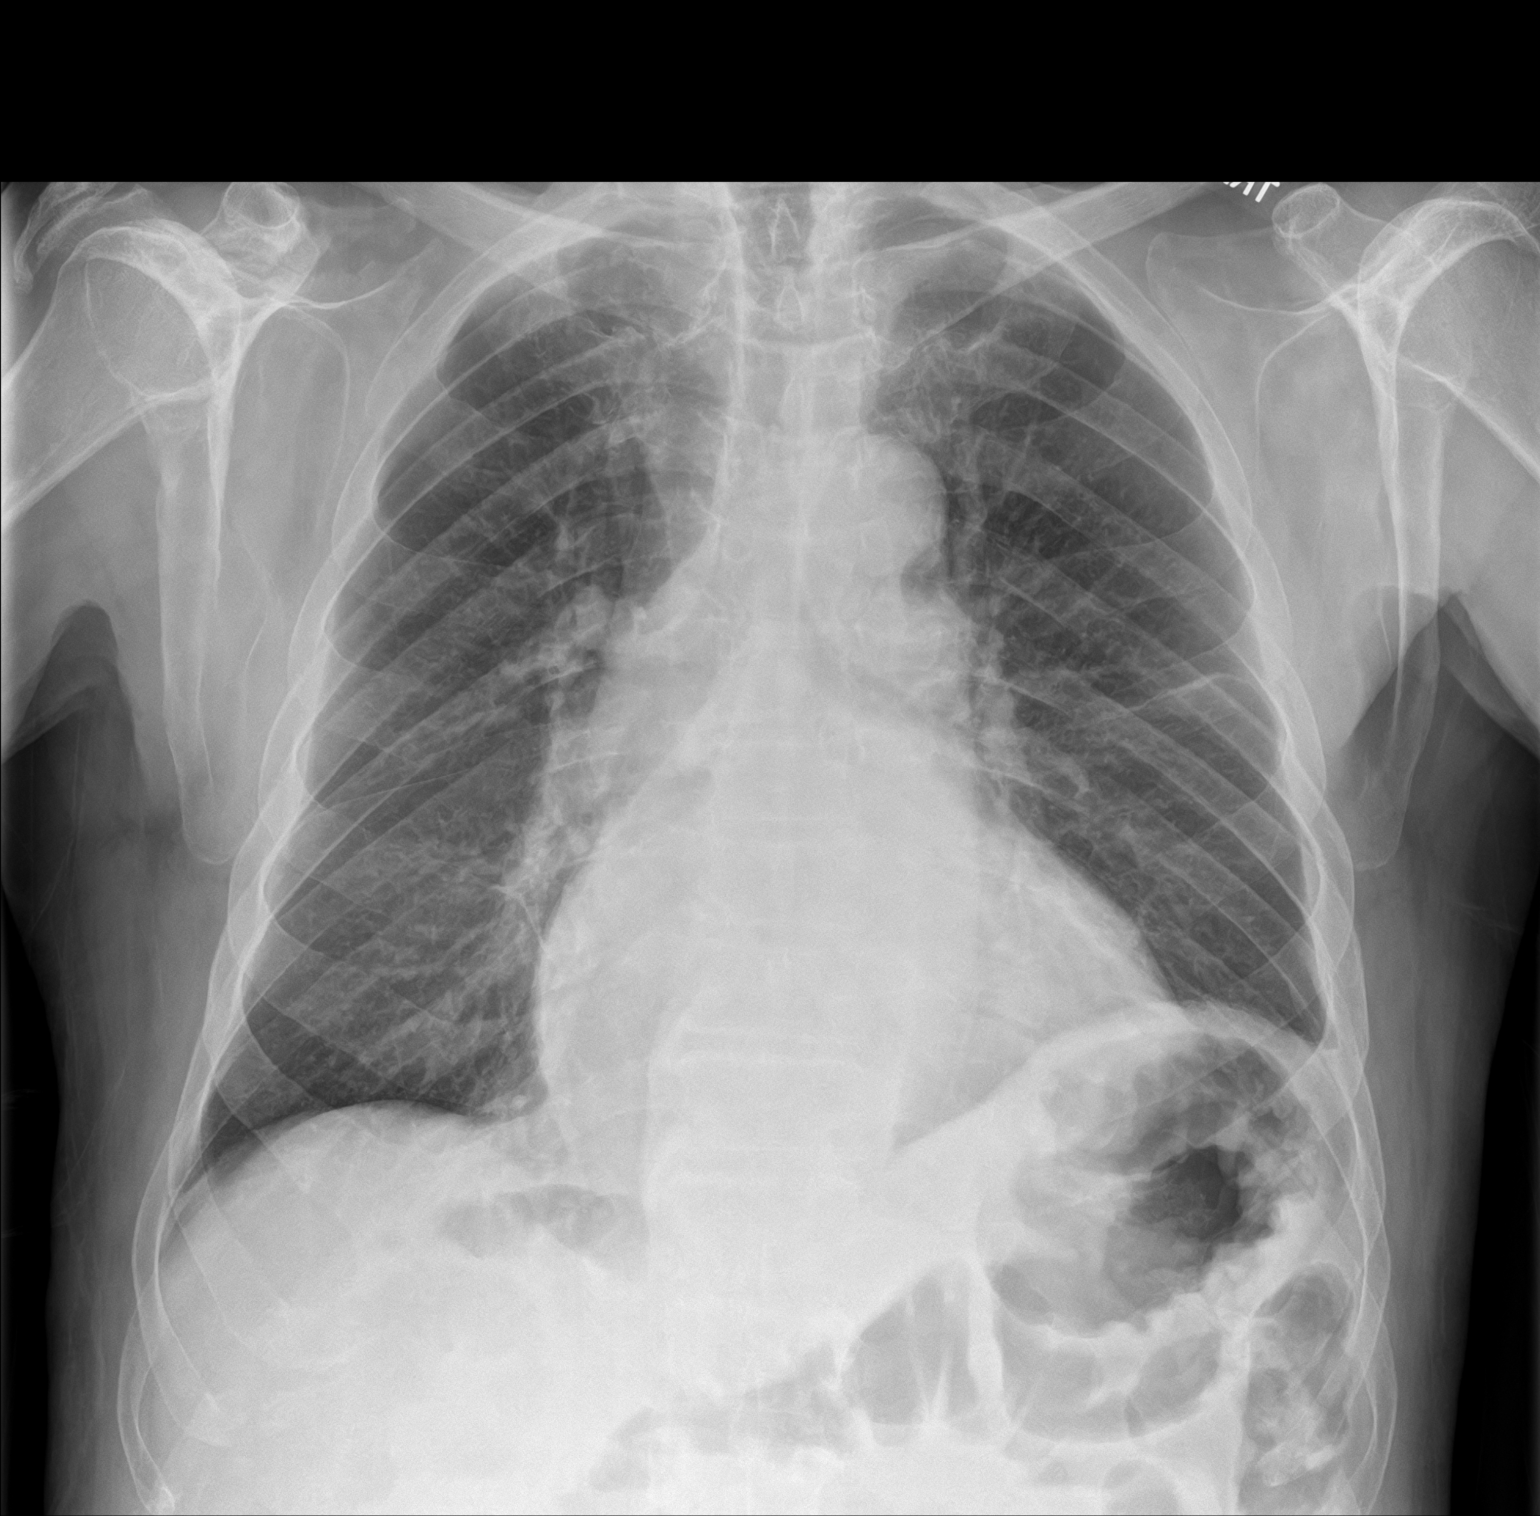
[im 2/2]
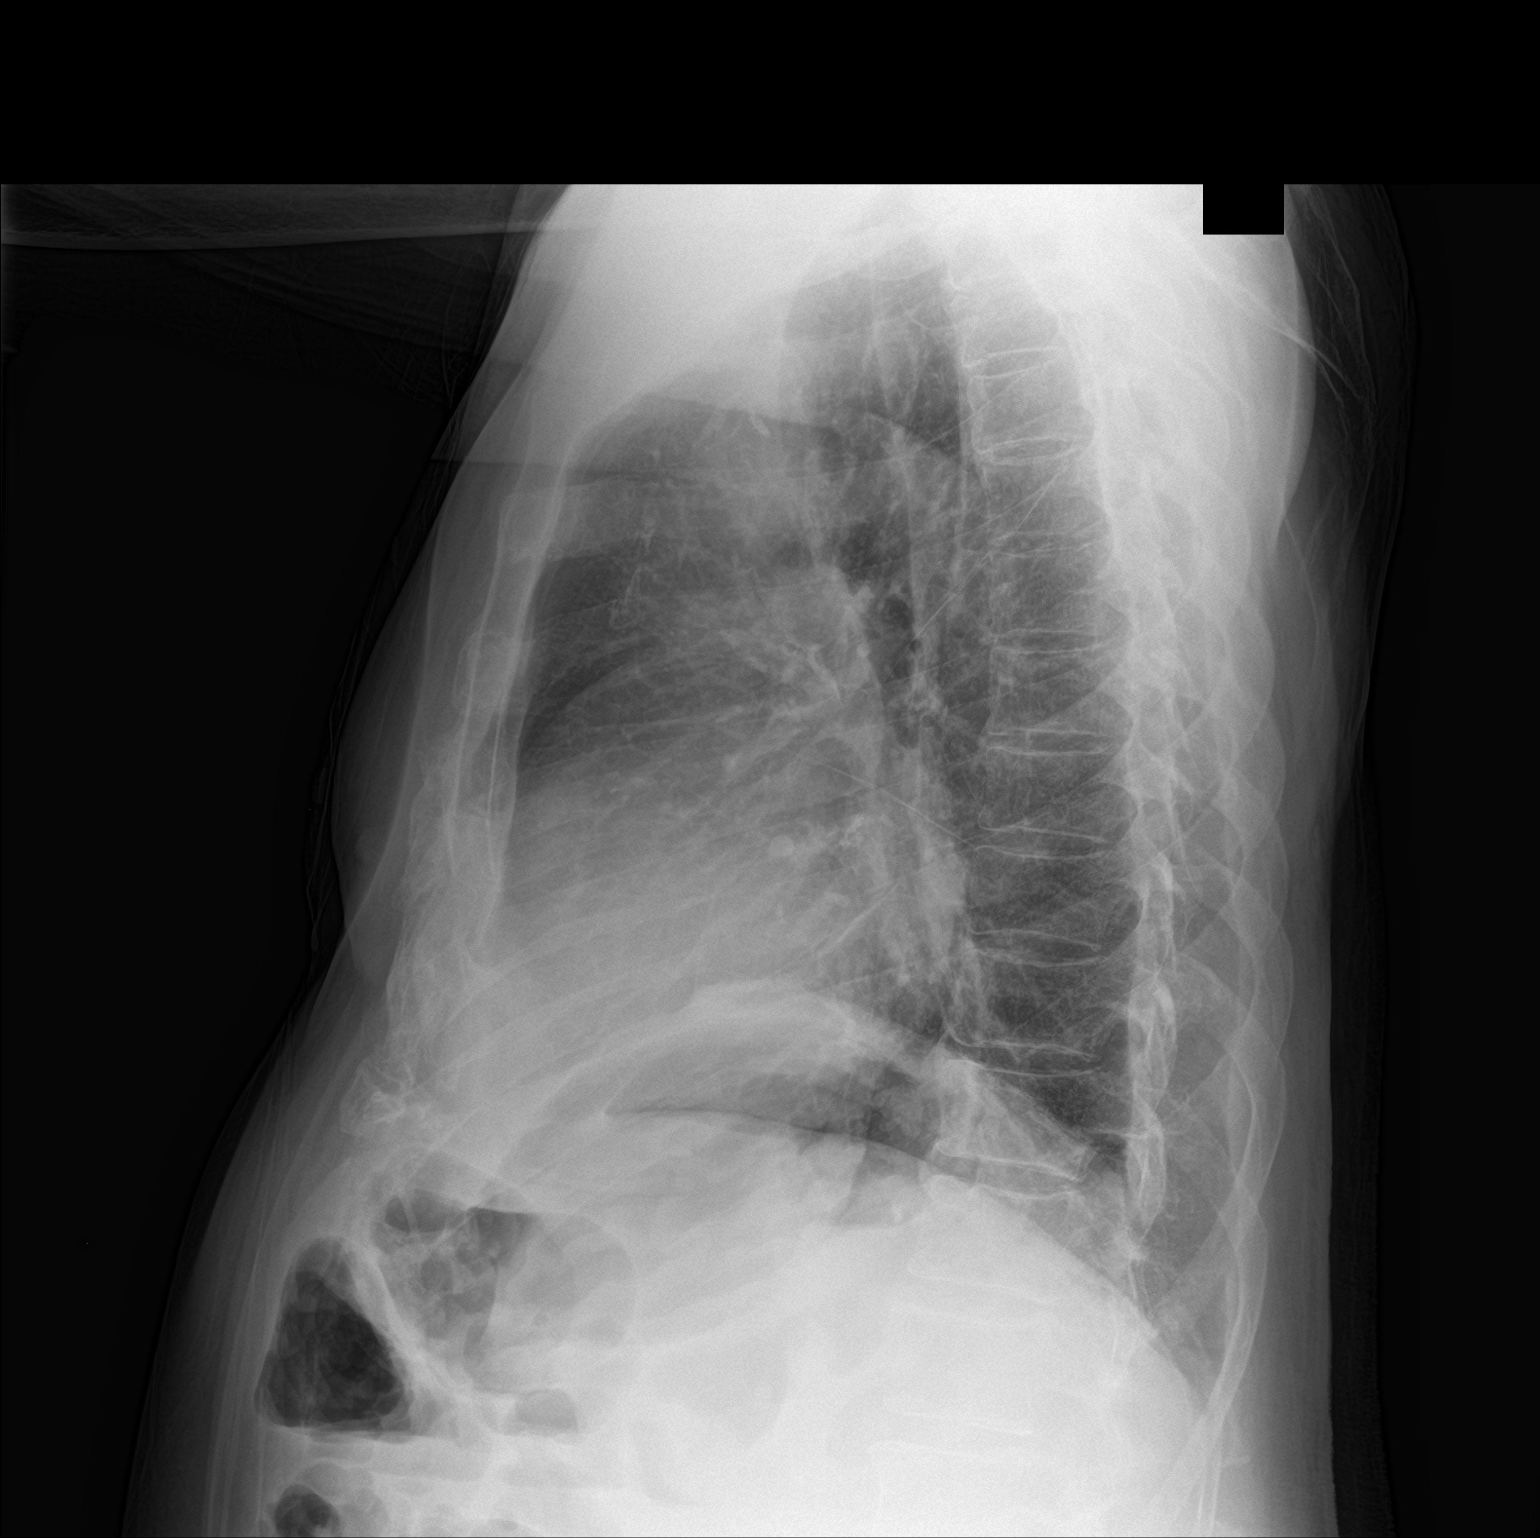

[2 of 2 positions shown; findings below may reference images not displayed]

FINDINGS: Cardiac shadow is at the upper limits of normal in size but stable.
Postsurgical changes are seen in the cervical spine. The lungs are
well aerated bilaterally. No scarring is noted in the left mid lung.
No sizable infiltrate or effusion is noted.
IMPRESSION: Acute abnormality noted.

## 2018-02-03 DEATH — deceased
